# Patient Record
Sex: Male | Born: 1965 | Race: Black or African American | Hispanic: No | Marital: Married | Smoking: Current every day smoker
Health system: Southern US, Community
[De-identification: ages and names within clinical notes are randomized; demographics above are authoritative.]

## PROBLEM LIST (undated history)

## (undated) DIAGNOSIS — E785 Hyperlipidemia, unspecified: Secondary | ICD-10-CM

## (undated) DIAGNOSIS — N529 Male erectile dysfunction, unspecified: Secondary | ICD-10-CM

## (undated) DIAGNOSIS — Z8719 Personal history of other diseases of the digestive system: Secondary | ICD-10-CM

## (undated) DIAGNOSIS — I2510 Atherosclerotic heart disease of native coronary artery without angina pectoris: Secondary | ICD-10-CM

## (undated) DIAGNOSIS — K219 Gastro-esophageal reflux disease without esophagitis: Secondary | ICD-10-CM

## (undated) DIAGNOSIS — I10 Essential (primary) hypertension: Secondary | ICD-10-CM

## (undated) HISTORY — PX: COLONOSCOPY: SHX174

## (undated) HISTORY — PX: NO PAST SURGERIES: SHX2092

---

## 2006-05-11 ENCOUNTER — Emergency Department: Payer: Self-pay | Admitting: Emergency Medicine

## 2006-05-12 ENCOUNTER — Emergency Department: Payer: Self-pay | Admitting: Emergency Medicine

## 2006-05-13 ENCOUNTER — Emergency Department: Payer: Self-pay | Admitting: Emergency Medicine

## 2007-10-01 ENCOUNTER — Ambulatory Visit: Payer: Self-pay | Admitting: Family Medicine

## 2009-11-15 ENCOUNTER — Emergency Department: Payer: Self-pay | Admitting: Emergency Medicine

## 2012-03-11 ENCOUNTER — Ambulatory Visit: Payer: Self-pay | Admitting: Internal Medicine

## 2012-11-23 ENCOUNTER — Inpatient Hospital Stay: Payer: Self-pay | Admitting: Surgery

## 2012-11-23 ENCOUNTER — Ambulatory Visit: Payer: Self-pay | Admitting: Emergency Medicine

## 2012-11-23 LAB — URINALYSIS, COMPLETE
Bilirubin,UR: NEGATIVE
Cellular Cast: 15
Glucose,UR: NEGATIVE mg/dL (ref 0–75)
Hyaline Cast: 59
Leukocyte Esterase: NEGATIVE
Nitrite: NEGATIVE
Ph: 5 (ref 4.5–8.0)
RBC,UR: 4 /HPF (ref 0–5)
Specific Gravity: 1.028 (ref 1.003–1.030)
WBC UR: 24 /HPF (ref 0–5)

## 2012-11-23 LAB — COMPREHENSIVE METABOLIC PANEL
Albumin: 3.5 g/dL (ref 3.4–5.0)
Anion Gap: 9 (ref 7–16)
Bilirubin,Total: 1.6 mg/dL — ABNORMAL HIGH (ref 0.2–1.0)
Chloride: 105 mmol/L (ref 98–107)
Co2: 23 mmol/L (ref 21–32)
Creatinine: 1.32 mg/dL — ABNORMAL HIGH (ref 0.60–1.30)
Glucose: 109 mg/dL — ABNORMAL HIGH (ref 65–99)
Potassium: 3.9 mmol/L (ref 3.5–5.1)
SGOT(AST): 15 U/L (ref 15–37)
SGPT (ALT): 16 U/L (ref 12–78)
Sodium: 137 mmol/L (ref 136–145)
Total Protein: 7.9 g/dL (ref 6.4–8.2)

## 2012-11-23 LAB — CBC
MCH: 29.2 pg (ref 26.0–34.0)
MCHC: 34.5 g/dL (ref 32.0–36.0)
MCV: 85 fL (ref 80–100)
Platelet: 265 10*3/uL (ref 150–440)
RBC: 5.18 10*6/uL (ref 4.40–5.90)

## 2012-11-24 LAB — COMPREHENSIVE METABOLIC PANEL
Albumin: 2.8 g/dL — ABNORMAL LOW (ref 3.4–5.0)
Alkaline Phosphatase: 75 U/L (ref 50–136)
Anion Gap: 8 (ref 7–16)
BUN: 13 mg/dL (ref 7–18)
Calcium, Total: 8.7 mg/dL (ref 8.5–10.1)
Chloride: 106 mmol/L (ref 98–107)
Co2: 23 mmol/L (ref 21–32)
Creatinine: 0.91 mg/dL (ref 0.60–1.30)
EGFR (African American): >60
EGFR (Non-African Amer.): >60
SGOT(AST): 14 U/L — ABNORMAL LOW (ref 15–37)
SGPT (ALT): 13 U/L (ref 12–78)
Total Protein: 6.5 g/dL (ref 6.4–8.2)

## 2012-11-24 LAB — CBC WITH DIFFERENTIAL/PLATELET
Basophil %: 0.2 %
HCT: 37.3 % — ABNORMAL LOW (ref 40.0–52.0)
HGB: 12.6 g/dL — ABNORMAL LOW (ref 13.0–18.0)
Lymphocyte %: 8.8 %
Monocyte #: 1.1 x10 3/mm — ABNORMAL HIGH (ref 0.2–1.0)
Neutrophil %: 83.8 %
Platelet: 227 10*3/uL (ref 150–440)
RBC: 4.37 10*6/uL — ABNORMAL LOW (ref 4.40–5.90)
RDW: 13.5 % (ref 11.5–14.5)
WBC: 15.3 10*3/uL — ABNORMAL HIGH (ref 3.8–10.6)

## 2012-11-25 LAB — CBC WITH DIFFERENTIAL/PLATELET
Basophil #: 0 10*3/uL (ref 0.0–0.1)
Basophil %: 0.2 %
Eosinophil #: 0.1 10*3/uL (ref 0.0–0.7)
Eosinophil %: 0.4 %
HGB: 11.9 g/dL — ABNORMAL LOW (ref 13.0–18.0)
Lymphocyte #: 1.6 10*3/uL (ref 1.0–3.6)
Lymphocyte %: 9.5 %
MCH: 28.8 pg (ref 26.0–34.0)
MCHC: 33.9 g/dL (ref 32.0–36.0)
MCV: 85 fL (ref 80–100)
Monocyte #: 1 x10 3/mm (ref 0.2–1.0)
Monocyte %: 5.9 %
Neutrophil %: 84 %
Platelet: 213 10*3/uL (ref 150–440)

## 2012-11-26 LAB — CBC WITH DIFFERENTIAL/PLATELET
Basophil #: 0.1 10*3/uL (ref 0.0–0.1)
Eosinophil %: 0.5 %
HCT: 35.2 % — ABNORMAL LOW (ref 40.0–52.0)
HGB: 11.9 g/dL — ABNORMAL LOW (ref 13.0–18.0)
MCH: 28.8 pg (ref 26.0–34.0)
MCHC: 33.9 g/dL (ref 32.0–36.0)
Monocyte #: 1 x10 3/mm (ref 0.2–1.0)
Neutrophil #: 13.4 10*3/uL — ABNORMAL HIGH (ref 1.4–6.5)
Neutrophil %: 81.9 %
Platelet: 235 10*3/uL (ref 150–440)
RBC: 4.15 10*6/uL — ABNORMAL LOW (ref 4.40–5.90)
RDW: 13.3 % (ref 11.5–14.5)
WBC: 16.4 10*3/uL — ABNORMAL HIGH (ref 3.8–10.6)

## 2012-11-27 LAB — CBC WITH DIFFERENTIAL/PLATELET
Basophil %: 0.3 %
Eosinophil %: 1.8 %
Lymphocyte #: 1.8 10*3/uL (ref 1.0–3.6)
Lymphocyte %: 14.9 %
MCHC: 34.3 g/dL (ref 32.0–36.0)
Monocyte #: 1.1 x10 3/mm — ABNORMAL HIGH (ref 0.2–1.0)
Monocyte %: 9.5 %
RBC: 4.06 10*6/uL — ABNORMAL LOW (ref 4.40–5.90)
RDW: 13.5 % (ref 11.5–14.5)
WBC: 11.9 10*3/uL — ABNORMAL HIGH (ref 3.8–10.6)

## 2013-01-05 ENCOUNTER — Ambulatory Visit: Payer: Self-pay | Admitting: Gastroenterology

## 2014-08-14 ENCOUNTER — Observation Stay: Payer: Self-pay | Admitting: Internal Medicine

## 2014-08-14 ENCOUNTER — Ambulatory Visit: Payer: Self-pay | Admitting: Registered Nurse

## 2014-08-14 LAB — CK TOTAL AND CKMB (NOT AT ARMC)
CK, TOTAL: 132 U/L (ref 39–308)
CK, TOTAL: 114 U/L (ref 39–308)
CK, Total: 156 U/L (ref 39–308)
CK-MB: 0.6 ng/mL (ref 0.5–3.6)
CK-MB: <0.5 ng/mL — ABNORMAL LOW (ref 0.5–3.6)
CK-MB: 0.5 ng/mL (ref 0.5–3.6)

## 2014-08-14 LAB — COMPREHENSIVE METABOLIC PANEL
ALK PHOS: 97 U/L (ref 46–116)
ANION GAP: 6 — AB (ref 7–16)
Albumin: 3.4 g/dL (ref 3.4–5.0)
BUN: 13 mg/dL (ref 7–18)
Bilirubin,Total: 0.4 mg/dL (ref 0.2–1.0)
CO2: 26 mmol/L (ref 21–32)
Calcium, Total: 8.5 mg/dL (ref 8.5–10.1)
Chloride: 108 mmol/L — ABNORMAL HIGH (ref 98–107)
Creatinine: 1.17 mg/dL (ref 0.60–1.30)
Glucose: 92 mg/dL (ref 65–99)
Osmolality: 279 (ref 275–301)
POTASSIUM: 4 mmol/L (ref 3.5–5.1)
SGOT(AST): 24 U/L (ref 15–37)
SGPT (ALT): 20 U/L (ref 14–63)
SODIUM: 140 mmol/L (ref 136–145)
Total Protein: 7.3 g/dL (ref 6.4–8.2)

## 2014-08-14 LAB — PROTIME-INR
INR: 0.9
PROTHROMBIN TIME: 12.4 s

## 2014-08-14 LAB — TROPONIN I: Troponin-I: <0.02 ng/mL

## 2014-08-14 LAB — CBC
HCT: 46 % (ref 40.0–52.0)
HGB: 15.4 g/dL (ref 13.0–18.0)
MCH: 28.8 pg (ref 26.0–34.0)
MCHC: 33.5 g/dL (ref 32.0–36.0)
MCV: 86 fL (ref 80–100)
Platelet: 254 10*3/uL (ref 150–440)
RBC: 5.36 10*6/uL (ref 4.40–5.90)
RDW: 13.3 % (ref 11.5–14.5)
WBC: 14.2 10*3/uL — ABNORMAL HIGH (ref 3.8–10.6)

## 2014-08-14 LAB — TSH: THYROID STIMULATING HORM: 1.7 u[IU]/mL

## 2014-08-14 LAB — APTT: ACTIVATED PTT: 36 s — AB (ref 23.6–35.9)

## 2014-08-15 LAB — BASIC METABOLIC PANEL
Anion Gap: 5 — ABNORMAL LOW (ref 7–16)
BUN: 12 mg/dL (ref 7–18)
CREATININE: 1.11 mg/dL (ref 0.60–1.30)
Calcium, Total: 8 mg/dL — ABNORMAL LOW (ref 8.5–10.1)
Chloride: 108 mmol/L — ABNORMAL HIGH (ref 98–107)
Co2: 26 mmol/L (ref 21–32)
EGFR (African American): >60
EGFR (Non-African Amer.): >60
GLUCOSE: 117 mg/dL — AB (ref 65–99)
Osmolality: 278 (ref 275–301)
POTASSIUM: 3.8 mmol/L (ref 3.5–5.1)
Sodium: 139 mmol/L (ref 136–145)

## 2014-08-15 LAB — CBC WITH DIFFERENTIAL/PLATELET
BASOS ABS: 0.1 10*3/uL (ref 0.0–0.1)
Basophil %: 1.2 %
EOS ABS: 0.4 10*3/uL (ref 0.0–0.7)
Eosinophil %: 4.1 %
HCT: 41.2 % (ref 40.0–52.0)
HGB: 13.6 g/dL (ref 13.0–18.0)
Lymphocyte #: 4.3 10*3/uL — ABNORMAL HIGH (ref 1.0–3.6)
Lymphocyte %: 41.9 %
MCH: 28.4 pg (ref 26.0–34.0)
MCHC: 33 g/dL (ref 32.0–36.0)
MCV: 86 fL (ref 80–100)
MONOS PCT: 6.3 %
Monocyte #: 0.6 x10 3/mm (ref 0.2–1.0)
NEUTROS ABS: 4.7 10*3/uL (ref 1.4–6.5)
NEUTROS PCT: 46.5 %
Platelet: 238 10*3/uL (ref 150–440)
RBC: 4.79 10*6/uL (ref 4.40–5.90)
RDW: 13.6 % (ref 11.5–14.5)
WBC: 10.2 10*3/uL (ref 3.8–10.6)

## 2014-08-15 LAB — HEPARIN LEVEL (UNFRACTIONATED): Anti-Xa(Unfractionated): 0.42 IU/mL (ref 0.30–0.70)

## 2014-10-29 NOTE — H&P (Signed)
PATIENT NAME:  Paul Grant, Paul Grant MR#:  [id number] DATE OF BIRTH:  1966-01-08  DATE OF ADMISSION:  11/23/2012  REASON FOR ADMISSION: Suprapubic pain three days, nausea, vomiting, subjective fever and chills and constipation.   HISTORY OF PRESENT ILLNESS: Mr. Paul Grant is a pleasant relatively healthy 49 year old male with history of gastroesophageal reflux disease on Protonix who presents with acute onset of suprapubic pain. He says that it began three days ago and has progressively gotten worse. Yesterday it was excruciating and may be slightly better today and worse with movement, improved with Advil. It is also associated with nausea, vomiting, particularly yesterday. He  has not eaten anything recently. He also has subjective fevers and chills. Last bowel movement was yesterday, but appeared to be small, feels distended. Has pain with urination as well. No chest pain, shortness of breath, cough or hematuria.   PAST MEDICAL HISTORY:  1.  History of acid reflux.  2.  History of fracture.  HOME MEDICATIONS: Protonix 40 mg p.o. daily.   ALLERGIES: No known drug allergies.   SOCIAL HISTORY: [place] in [place]. He is here with his girlfriend, sister, mother and daughter. Smokes 1 pack a day. Denies any significant alcohol use or drugs.   FAMILY HISTORY: Family history of diabetes and hypertension. No family history of cancer, coronary artery disease and cerebrovascular accident.   REVIEW OF SYSTEMS: A 12-point review of systems review of systems obtained. Pertinent positives and negatives as above.   PHYSICAL EXAMINATION: VITAL SIGNS: Temperature 99.4, pulse 97, blood pressure 104/65, respirations 20, 96% on room air.  GENERAL: No acute distress. Alert and oriented x 3.     HEAD: Normocephalic, atraumatic.  EYES: No scleral icterus. No conjunctivitis.  FACE: No obvious facial trauma. Normal external nose. Normal external ears.  CHEST: Lungs clear to auscultation, moving air well.  HEART:  Regular rate and rhythm. No murmurs, rubs or gallops.  ABDOMEN: Soft, nondistended. Tender to palpation, particularly the suprapubic region, plus rebound. Does have tenderness in supraumbilical region when palpating the supraumbilical region in the lower umbilical  EXTREMITIES: Moves all extremities well. Strength 5/5.  NEUROLOGIC: Cranial nerves II through XII grossly intact.   LABORATORY DATA: Are as follows:  I have reviewed all labs. White cell count of 19.4, hemoglobin 13.1, hematocrit 43.8, platelets are 265. Bilirubin 1.6, creatinine is 1.32, bicarbonate is 23. Urinalysis also shows 24 white cells and 4 red cells.   CT scan shows impressive sigmoid diverticulitis with subdiaphragmatic and pericolonic air as well as some pelvic fluid.   ASSESSMENT AND PLAN: Mr. Paul Grant pleasant 49 year old male who presents acute diverticulitis. He is currently hemodynamically stable; however, due to the impressive nature of diverticulitis, would recommend nothing by mouth, IV antibiotics and resuscitation and will perform serial abdominal exams. No need for emergent surgery at this time. We will continue to follow. I have explained this to him and his family and they agree with this plan.    ____________________________ Si Raiderhristopher A. Taiga Lupinacci, MD cal:cc D: 11/23/2012 18:19:05 ET T: 11/23/2012 18:47:14 ET JOB#: [id number]  cc: Cristal Deerhristopher A. Rowyn Spilde, MD, <Dictator> Jarvis NewcomerHRISTOPHER A Twana Wileman MD ELECTRONICALLY SIGNED 11/27/2012 12:59

## 2014-10-29 NOTE — Discharge Summary (Signed)
PATIENT NAME:  Paul Grant, Oral C MR#:  [id number] DATE OF BIRTH:  09/26/1965  DATE OF ADMISSION:  11/23/2012 DATE OF DISCHARGE:  11/29/2012  DATE OF DICTATION: 12/11/2012   FINAL DIAGNOSIS: Perforated complicated sigmoid diverticulitis.   PRINCIPAL PROCEDURES:  1. CT scan of the abdomen and pelvis.  2. Intravenous antibiotics.   HOSPITAL COURSE SUMMARY: The patient was admitted on the 18th of May with a perforated sigmoid diverticulitis. He was treated with intravenous antibiotics. His white count remained elevated for several days. The patient, on hospital day #1, was still with significant tenderness. On hospital day #2, continued with tenderness. He had tenderness overlying the suprapubic region. I did discuss with him briefly possible surgical intervention on hospital day #2 if he did not improve. On hospital day #3, the patient was feeling much better, continuing to have low-grade temperatures to greater than 100. His antibiotics were continued. On the 23rd, a CT scan was obtained looking for drainable fluid collection. There was a tiny fluid collection around the colon. This was left alone. Overall, the patient continued to improve. By the 24th, he was tolerating a regular diet. His abdomen was soft and completely nontender and was discharged home in stable condition.   DISCHARGE MEDICATIONS:  1. Zantac 150 mg by mouth b.i.d.  2. Cipro 500 mg by mouth b.i.d.  3. Flagyl 500 mg by mouth t.i.d.  4. Percocet 5/325 one tab every 6 hours as needed for pain.   DISCHARGE INSTRUCTIONS: He will follow up in the office with Dr. Juliann PulseLundquist in 2 weeks. He will resume regular diet and activity.   ____________________________ Redge GainerMark A. Egbert GaribaldiBird, MD mab:OSi D: 12/11/2012 06:16:56 ET T: 12/11/2012 06:33:53 ET JOB#: [id number]  cc: Loraine LericheMark A. Egbert GaribaldiBird, MD, <Dictator> Shahla Betsill A Marvalene Barrett MD ELECTRONICALLY SIGNED 12/11/2012 8:14

## 2014-11-07 NOTE — Consult Note (Signed)
PATIENT NAME:  SUSIE, POUSSON MR#:  [id number] DATE OF BIRTH:  1966/06/19  DATE OF CONSULTATION:  08/14/2014  REFERRING PHYSICIAN:  Duane Lope. Judithann Sheen, MD CONSULTING PHYSICIAN:  Dwayne D. Callwood, MD  INDICATION: Atrial fibrillation and malignant hypertension.  HISTORY OF PRESENT ILLNESS: The patient is a 49 year old African American male, history of reflux disease, smoking, history of coronary artery disease, who presented to the Emergency Room with palpitations, tachycardia, shortness of breath over the last 24 hours. He was found to be in rapid atrial fibrillation as well as hypertensive. Denied any significant chest pain. Complained of dyspnea, shortness of breath. Initial troponin and TSH normal. EKG had nonspecific findings. He was admitted for further evaluation and care.  REVIEW OF SYSTEMS: No blackout spells or syncope. No nausea or vomiting. No fever. No chills. No sweats. No weight loss. No weight gain, hemoptysis, hematemesis. No bright red blood per rectum. Complains of smoking, shortness of breath, dyspnea, palpitations, tachycardia.   PAST MEDICAL HISTORY: Reflux, DJD, smoking.   MEDICATIONS: Zantac for reflux sometimes is necessary.   ALLERGIES: Negative.    SOCIAL HISTORY: Smokes. Denies significant alcohol consumption. Works for the city of [employer].  FAMILY HISTORY: Parents had hypertension, lung cancer, diabetes, heart disease, sick sinus syndrome.   PHYSICAL EXAMINATION:  VITAL SIGNS: Blood pressure initially was 180, pulse of 100, heart rate is 80 and irregular, respiratory rate is 16, afebrile.  HEENT: Normocephalic, atraumatic. Pupils equal and reactive to light. NECK: Supple. No JVD, bruits or adenopathy.  LUNGS: Clear to auscultation and percussion. No significant wheeze, rhonchi, or rale.  HEART: Irregularly irregular. Systolic ejection murmur at left sternal border. PMI nondisplaced.  ABDOMEN: Benign.  EXTREMITY: Within normal limits.  NEUROLOGIC: Intact.   SKIN: Normal.    LABORATORY DATA:  EKG: Rapid atrial fibrillation. Nonspecific ST-T changes. White count of 14, hemoglobin of 15. Troponin less than 0.02. TSH 1.7, glucose of 92, BUN 13, creatinine 1.17, sodium 140, potassium 4.0. Chest x-ray negative.   ASSESSMENT: New-onset atrial fibrillation, poorly controlled hypertension, smoking, strong family history of cardiac disease, degenerative joint disease, reflux, shortness of breath.   PLAN:  1.  Agree with admit. Rule out for myocardial infarction. Follow up cardiac enzymes. Follow up EKGs. Place on telemetry. Recommend treat atrial fibrillation for rate control. Hopefully the patient will convert on his own. Do not recommend antiarrhythmic this point. Continue short-term anticoagulation. The patient has a low CHADS score at this stage. Echocardiogram would be helpful to assess structural integrity of the heart for any abnormalities. He has a mild murmur as well as shortness of breath. Would consider further evaluation including functional study as well as echocardiogram.  2.  For reflux, continue Zantac. Will consider omeprazole therapy for reflux symptoms. Must be concerned that this may represent angina, but will treat for reflux now.  3.  Hypertension. Recommend possibly an ACE inhibitor or low-dose beta blocker with diuretic to see if the patient responds. 4.  Smoking. Advised the patient to quit smoking as part of smoking prevention. Lipid profile should be evaluated and assessed.  5.  Significant family history. Will need further evaluation and care.  Again, initial atrial fibrillation rate control with beta blocker, calcium blocker. Add ACE inhibitor for blood pressure control. Short-term anticoagulation including aspirin. Base further evaluation on the results of studies for his heart. No long-term anticoagulation is necessary at this point.    ____________________________ Bobbie Stack. Juliann Pares, MD ddc:ST D: 08/14/2014 23:55:52  ET T: 08/15/2014 01:09:25 ET JOB#:  [id number]  cc: Dwayne D. Juliann Paresallwood, MD, <Dictator> Alwyn PeaWAYNE D CALLWOOD MD ELECTRONICALLY SIGNED 08/17/2014 17:46

## 2014-11-07 NOTE — Discharge Summary (Signed)
PATIENT NAME:  Paul Grant, Paul Grant MR#:  [id number] DATE OF BIRTH:  12-07-1965  DATE OF ADMISSION:  08/14/2014 DATE OF DISCHARGE:  08/15/2014  ADMITTING DIAGNOSIS:  Atrial fibrillation, rapid ventricular response.  DISCHARGE DIAGNOSES: 1.  Atrial fibrillation, rapid ventricular response, converted to sinus rhythm. 2.  Dyspnea due to atrial fibrillation, rapid ventricular response.  3.  Malignant essential hypertension.  4.  Leukocytosis, likely stress related.  5.  Tobacco abuse. 6.  History of gastroesophageal reflux disease.  7.  Cardiomyopathy on echocardiogram with ejection fraction of 35%-40%. 8.  Mild tricuspid regurgitation as well as mild mitral valve regurgitation.   DISCHARGE CONDITION: Stable.   DISCHARGE MEDICATIONS: The patient is to continue Zantac 150 mg p.o. once daily, aspirin 81 mg p.o. daily. New medication, diltiazem extended release 120 mg p.o. once daily.   HOME OXYGEN: None.   DIET: Two-gram salt, low-fat, low-cholesterol diet. Consistency: Regular.   ACTIVITY LIMITATIONS: As tolerated.   FOLLOWUP APPOINTMENT: With Dr. Juliann Pares in 2 days after discharge.   HISTORY OF PRESENT ILLNESS: The patient is a 49 year old Caucasian male with past medical history significant for history of gastroesophageal reflux, who presents to the hospital with complaints of palpitations as well as shortness of breath. Please refer to Dr. Judithann Sheen admission note on 08/14/2014.   On arrival to the hospital the patient's temperature was 98.6, pulse was 82, blood pressure 173/101, respiration rate was 18, oxygen saturation was 97% on room air. Physical exam revealed irregular, irregular rhythm, but otherwise no abnormalities.  LABORATORY DATA: Done on arrival to the Emergency Room showed a normal BMP. The patient's liver enzymes were unremarkable. Cardiac enzymes x 3 were normal. TSH was 1.7, which is normal. White blood cell count was moderately elevated at 14.2, hemoglobin was 15.4, platelet  count was 254,000. Coagulation panel revealed pro time 12.4, INR 0.9, activated PTT was 36.0.  HOSPITAL COURSE: The patient was admitted to the hospital for further evaluation. His cardiac enzymes were cycled and consultation with Dr. Juliann Pares was obtained. Dr. Juliann Pares saw the patient in consultation the same day on 08/14/2014.  He felt the patient had atrial fibrillation, RVR.  He recommended to continue control of this atrial fibrillation, RVR with beta blocker as well as calcium channel blocker. He recommended also to add ACE inhibitor for blood pressure control.   The patient was initiated on medications and he converted into sinus rhythm. With conversion to sinus rhythm, his heart rate as well as blood pressure improved. He was advised to continue calcium channel blocker, Cardizem CD at 120 mg p.o. daily dose. His blood pressure with this dose remains somewhat marginal and we were not able to initiate ACE inhibitor due to blood pressure problems, which the patient would benefit from for cardiomyopathy. In regard to  dyspnea, it was felt that patient's dyspnea was related to atrial fibrillation, RVR, as well as possibly cardiomyopathy itself. The patient is advised to continue followup with cardiologist as well as his primary care physician for further recommendations.   In regard to malignant essential hypertension, the patient's blood pressure was well controlled with diltiazem extended release and on the day of discharge it was 100/65.   The patient was noted to have mild leukocytosis, which was likely stress related since his white blood cell count improved without antimicrobial therapy dayly. For tobacco abuse, he was counseled and recommended to quit. Nicotine replacement therapy was not initiated, although would be beneficial.  For history of gastroesophageal reflux disease, the patient is to  continue his outpatient management. No changes were made. The patient is being discharged in stable  condition with the above-mentioned medications and followup.   TIME SPENT: 35-40 minutes.    ____________________________ Katharina Caperima Ival Basquez, MD rv:LT D: 08/15/2014 15:17:23 ET T: 08/15/2014 17:15:12 ET JOB#: [id number]  cc: Katharina Caperima Adream Parzych, MD, <Dictator> Dwayne D. Juliann Paresallwood, MD Katharina CaperIMA Kenyatte Chatmon MD ELECTRONICALLY SIGNED 08/31/2014 22:07

## 2014-11-07 NOTE — H&P (Signed)
PATIENT NAME:  GIOVANIE, LEFEBRE MR#:  [id number] DATE OF BIRTH:  March 30, 1966  DATE OF ADMISSION:  08/14/2014  REFERRING PHYSICIAN: Dr. Daryel November.   FAMILY PHYSICIAN: Nonlocal.   REASON FOR ADMISSION: New ASA with malignant hypertension.   HISTORY OF PRESENT ILLNESS: The patient is a 49 year old male with history of GE reflux disease, who presents to the emergency room with palpitations and shortness of breath. In the emergency room, the patient was noted to be malignantly hypertensive in rapid atrial fibrillation. No chest pain. Initial troponin and TSH were normal. EKG showed no ischemic changes. He is now admitted for further evaluation.   PAST MEDICAL HISTORY: 1. GE reflux disease.  2. Status post left ankle fracture.  3. Tobacco abuse.   MEDICATIONS: Zantac 150 mg p.o. daily.   ALLERGIES: No known drug allergies.   SOCIAL HISTORY: The patient smokes about 1 pack per day. Denies alcohol abuse.   FAMILY HISTORY: Positive for positive for hypertension, lung cancer, diabetes and heart disease.   REVIEW OF SYSTEMS:  CONSTITUTIONAL: No fever or change in weight.   EYES: No blurred or double vision. No glaucoma.   ENT: No tinnitus or hearing loss. No nasal discharge or bleeding. No difficulty swallowing.   RESPIRATORY: No cough or wheezing. Denies hemoptysis. No painful respiration.   CARDIOVASCULAR: No chest pain or orthopnea. No syncope.   GASTROINTESTINAL: No nausea, vomiting, or diarrhea. No abdominal pain. No change in bowel habits.   GENITOURINARY: No dysuria or hematuria. No incontinence.   ENDOCRINE: No polyuria or polydipsia. No heat or cold intolerance.   HEMATOLOGIC: The patient denies anemia, easy bruising or bleeding.   LYMPHATIC: No swollen glands.   MUSCULOSKELETAL: The patient has pain in his neck, back, shoulders, knees or hips. No gout.   NEUROLOGIC: No numbness or migraines. Denies stroke or seizures.   PSYCHOLOGICAL: The patient denies  anxiety, insomnia or depression.   PHYSICAL EXAMINATION: GENERAL: The patient is in no acute distress.   VITAL SIGNS: Currently remarkable for a blood pressure of 123/101 with a heart rate of 82 and a respiratory rate of 18, temperature of 98.6 and a sat of 97% on room air.   HEENT: Normocephalic, atraumatic. Pupils are equal, round and reactive to light and accommodation, extraocular movements are intact. Sclerae are anicteric. Conjunctivae are clear. Oropharynx is clear.   NECK: Supple without JVD. No adenopathy or thyromegaly is noted.   LUNGS: Clear to auscultation and percussion without wheezes, rales or rhonchi. No dullness. Respiratory effort is normal.   CARDIAC: Irregularly irregular rhythm. No significant rubs or gallops. PMI is nondisplaced. Chest wall is nontender.   ABDOMEN: Soft, nontender, with normoactive bowel sounds. No organomegaly or masses were appreciated. No hernias or bruits were noted.   EXTREMITIES: Without clubbing, cyanosis or edema. Pulses were 2+ bilaterally.   SKIN: Warm and dry without rash or lesions.   NEUROLOGIC: Cranial nerves 2 through 12 grossly intact. Deep tendon reflexes were symmetric. Motor and sensory examination is nonfocal.   PSYCHIATRIC: Revealed a patient who was alert and oriented to person, place and time. He was cooperative and used good judgment.   LABORATORY DATA: EKG revealed atrial fibrillation with no acute ischemic changes at 99 beats per minute. His white count was 14.2 with a hemoglobin of 15.4. Troponin was less than 0.02. TSH was 1.7. Glucose 92 with a BUN of 13, creatinine 1.17 with a sodium of 140 and a potassium of 4.0.   DIAGNOSTIC DATA: Chest x-ray was  unremarkable.   ASSESSMENT: 1. New-onset atrial fibrillation.  2. Shortness of breath with dyspnea.  3. Malignant hypertension.  4. Gastroesophageal reflux disease.   PLAN: The patient will be observed on telemetry with oral Cardizem and beta blocker. A heparin drip  was started in the emergency room which we will continue for now. We will follow his blood pressure closely. Will order serial cardiac enzymes and an echocardiogram. We will also obtain a cardiology consult. Further treatment and evaluation will depend upon the patient's progress.   TOTAL TIME SPENT: On this patient was 45 minutes.    ____________________________ Duane LopeJeffrey D. Judithann SheenSparks, MD jds:TT D: 08/14/2014 16:38:45 ET T: 08/14/2014 16:46:53 ET JOB#: [id number]  cc: Duane LopeJeffrey D. Judithann SheenSparks, MD, <Dictator> Charlea Nardo Rodena Medin Tyjah Hai MD ELECTRONICALLY SIGNED 08/15/2014 13:35

## 2017-05-27 ENCOUNTER — Other Ambulatory Visit: Payer: Self-pay

## 2017-05-27 ENCOUNTER — Ambulatory Visit
Admission: EM | Admit: 2017-05-27 | Discharge: 2017-05-27 | Disposition: A | Discharge status: Home / Self Care | Payer: 59 | Attending: Family Medicine | Admitting: Family Medicine

## 2017-05-27 DIAGNOSIS — L03115 Cellulitis of right lower limb: Secondary | ICD-10-CM | POA: Diagnosis not present

## 2017-05-27 DIAGNOSIS — L02425 Furuncle of right lower limb: Secondary | ICD-10-CM | POA: Diagnosis not present

## 2017-05-27 DIAGNOSIS — L02429 Furuncle of limb, unspecified: Secondary | ICD-10-CM

## 2017-05-27 HISTORY — DX: Personal history of other diseases of the digestive system: Z87.19

## 2017-05-27 MED ORDER — SULFAMETHOXAZOLE-TRIMETHOPRIM 800-160 MG PO TABS: 1.0000 | ORAL_TABLET | Freq: Two times a day (BID) | ORAL | 0 refills | Status: DC

## 2017-05-27 NOTE — ED Provider Notes (Signed)
[facility]    CSN: [id number] Arrival date & time: 05/27/17  1625     History   Chief Complaint Chief Complaint  Patient presents with  . Abscess    HPI Paul Grant is a 51 y.o. male.   The history is provided by the patient.  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 History of diverticulitis     There are no active problems to display for this patient.   Past Surgical History:  Procedure Laterality Date  . NO PAST SURGERIES         Home Medications    Prior to Admission medications   Medication Sig Start Date End Date Taking? Authorizing Provider  sulfamethoxazole-trimethoprim (BACTRIM DS,SEPTRA DS) 800-160 MG tablet Take 1 tablet 2 (two) times daily by mouth. 05/27/17   Payton Mccallumonty, Tymir Terral, MD    Family History Family History  Problem Relation Age of Onset  . Diabetes Mother   . Hypertension Mother   . Heart attack Father   . Hypertension Father     Social History Social History   Tobacco Use  . Smoking status: Current Every Day Smoker    Packs/day: 0.10  . Smokeless tobacco: Never Used  Substance Use Topics  . Alcohol use: Yes    Comment: rarely  . Drug use: No     Allergies   Patient has no known allergies.   Review of Systems Review of Systems  Constitutional: Negative for fatigue and fever.  Gastrointestinal: Negative for anorexia, nausea and vomiting.  Neurological: Negative for headaches.      Physical Exam Triage Vital Signs ED Triage Vitals  Enc Vitals Group     BP 05/27/17 1640 (!) 134/94     Pulse Rate 05/27/17 1640 95     Resp 05/27/17 1640 18     Temp 05/27/17 1640 98.9 F (37.2 C)     Temp Source 05/27/17 1640 Oral     SpO2 05/27/17 1640 99 %     Weight 05/27/17 1639 173 lb (78.5 kg)     Height 05/27/17 1639 5\' 10"  (1.778 m)     Head Circumference --      Peak Flow --      Pain Score 05/27/17 1639 6     Pain Loc --      Pain Edu? --      Excl. in GC? --    No data found.  Updated Vital Signs BP (!) 134/94 (BP Location: Left Arm)   Pulse 95   Temp 98.9 F (37.2 C) (Oral)   Resp 18   Ht 5\' 10"  (1.778 m)   Wt 173 lb (78.5 kg)   SpO2 99%   BMI 24.82 kg/m   Visual Acuity Right Eye Distance:   Left Eye Distance:   Bilateral Distance:    Right Eye Near:   Left Eye Near:    Bilateral Near:     Physical Exam  Constitutional: He appears  well-developed and well-nourished. No distress.  Skin: He is not diaphoretic. There is erythema.  3 cm erythematous indurated skin area just above the knee with blanchable erythema, warmth and tenderness to palpation  Nursing note and vitals reviewed.    UC Treatments / Results  Labs (all labs ordered are listed, but only abnormal results are displayed) Labs Reviewed - No data to display  EKG  EKG Interpretation None       Radiology No results found.  Procedures Procedures (including critical care time)  Medications Ordered in UC Medications - No data to display   Initial Impression / Assessment and Plan / UC Course  I have reviewed the triage vital signs and the nursing notes.  Pertinent labs & imaging results that were available during my care of the patient were reviewed by me and considered in my medical decision making (see chart for details).       Final Clinical Impressions(s) / UC Diagnoses   Final diagnoses:  Boil, leg  Cellulitis of leg, right    ED Discharge Orders         Ordered    sulfamethoxazole-trimethoprim (BACTRIM DS,SEPTRA DS) 800-160 MG tablet  2 times daily     05/27/17 1700     1. diagnosis reviewed with patient 2. rx as per orders above; reviewed possible side effects, interactions, risks and benefits  3. Recommend supportive treatment with warm compresses, otc analgesics prn 4. Follow-up prn if symptoms worsen or don't improve  Controlled Substance Prescriptions [place] Controlled Substance Registry consulted? Not Applicable   Payton Mccallumonty, Rubin Dais, MD 05/27/17 1710

## 2017-05-27 NOTE — ED Triage Notes (Signed)
Patient complains of right knee abscess. Patient states that area started on Friday. Patient states that area has been red and tender. No drainage from area.

## 2017-05-31 ENCOUNTER — Other Ambulatory Visit: Payer: Self-pay

## 2017-05-31 ENCOUNTER — Encounter: Payer: Self-pay | Admitting: *Deleted

## 2017-05-31 ENCOUNTER — Ambulatory Visit
Admission: EM | Admit: 2017-05-31 | Discharge: 2017-05-31 | Disposition: A | Discharge status: Hospice | Payer: 59 | Attending: Family Medicine | Admitting: Family Medicine

## 2017-05-31 DIAGNOSIS — L03115 Cellulitis of right lower limb: Secondary | ICD-10-CM

## 2017-05-31 NOTE — ED Triage Notes (Signed)
Patient has returned for a wound check. Reports swelling, redness at the wound have become worse.

## 2017-05-31 NOTE — ED Provider Notes (Signed)
[facility]    CSN: [id number] Arrival date & time: 05/31/17  [id number]     History   Chief Complaint Chief Complaint  Patient presents with  . Wound Check    HPI Paul Grant is a 51 y.o. male.   51 yo male with a recent diagnosis of cellulitis of right lower extremity, just above the knee, presents with worsening symptoms of redness spreading, increase swelling and chills, despite taking oral antibiotic for one week.    The history is provided by the patient.  Wound Check     Past Medical History:  Diagnosis Date  . History of diverticulitis     There are no active problems to display for this patient.   Past Surgical History:  Procedure Laterality Date  . NO PAST SURGERIES         Home Medications    Prior to Admission medications   Medication Sig Start Date End Date Taking? Authorizing Provider  sulfamethoxazole-trimethoprim (BACTRIM DS,SEPTRA DS) 800-160 MG tablet Take 1 tablet 2 (two) times daily by mouth. 05/27/17  Yes Payton Mccallumonty, Toni Hoffmeister, MD    Family History Family History  Problem Relation Age of Onset  . Diabetes Mother   . Hypertension Mother   . Heart attack Father   . Hypertension Father     Social History Social History   Tobacco Use  . Smoking status: Current Every Day Smoker    Packs/day: 0.10  . Smokeless tobacco: Never Used  Substance Use Topics  . Alcohol use: Yes    Comment: rarely  . Drug use: No     Allergies   Patient has no known allergies.   Review of Systems Review of Systems   Physical Exam Triage Vital Signs ED Triage Vitals  Enc Vitals Group     BP 05/31/17 0959 122/86     Pulse Rate 05/31/17 0959 77     Resp 05/31/17 0959 18     Temp 05/31/17 0959 98.6 F (37 C)     Temp Source 05/31/17 0959 Oral     SpO2 05/31/17 0959 100 %     Weight --      Height --      Head Circumference --      Peak Flow --      Pain Score 05/31/17 1001 5     Pain Loc --      Pain Edu? --    Excl. in GC? --    No data found.  Updated Vital Signs BP 122/86 (BP Location: Left Arm)   Pulse 77   Temp 98.6 F (37 C) (Oral)   Resp 18   SpO2 100%   Visual Acuity Right Eye Distance:   Left Eye Distance:   Bilateral Distance:    Right Eye Near:   Left Eye Near:    Bilateral Near:     Physical Exam  Constitutional: He appears well-developed and well-nourished. No distress.  Skin: He is not diaphoretic. There is erythema.  Right lower extremity skin just above the knee with fluctuant yellow, erythematous mass with spreading blanchable erythema, warmth and tenderness to palpation  Nursing note and vitals reviewed.    UC Treatments / Results  Labs (all labs ordered are listed, but only abnormal results are displayed) Labs Reviewed - No data to display  EKG  EKG Interpretation None       Radiology No results found.  Procedures Procedures (including critical care time)  Medications Ordered in UC Medications -  No data to display   Initial Impression / Assessment and Plan / UC Course  I have reviewed the triage vital signs and the nursing notes.  Pertinent labs & imaging results that were available during my care of the patient were reviewed by me and considered in my medical decision making (see chart for details).       Final Clinical Impressions(s) / UC Diagnoses   Final diagnoses:  Cellulitis of right lower extremity  (failing outpatient therapy)  ED Discharge Orders    None     1. diagnosis reviewed with patient; discussed with patient, recommend patient go to Emergency Department for further evaluation and management due to worsening symptoms despite outpatient oral antibiotic therapy.   Controlled Substance Prescriptions [place] Controlled Substance Registry consulted? Not Applicable   Payton Mccallumonty, Daphney Hopke, MD 05/31/17 1152

## 2018-07-17 ENCOUNTER — Encounter: Payer: Self-pay | Admitting: Emergency Medicine

## 2018-07-17 ENCOUNTER — Other Ambulatory Visit: Payer: Self-pay

## 2018-07-17 ENCOUNTER — Ambulatory Visit
Admission: EM | Admit: 2018-07-17 | Discharge: 2018-07-17 | Disposition: A | Discharge status: Home / Self Care | Payer: 59 | Attending: Emergency Medicine | Admitting: Emergency Medicine

## 2018-07-17 DIAGNOSIS — L03115 Cellulitis of right lower limb: Secondary | ICD-10-CM | POA: Diagnosis not present

## 2018-07-17 MED ORDER — SULFAMETHOXAZOLE-TRIMETHOPRIM 800-160 MG PO TABS: 1.0000 | ORAL_TABLET | Freq: Two times a day (BID) | ORAL | 0 refills | Status: AC

## 2018-07-17 MED ORDER — MUPIROCIN 2 % EX OINT: TOPICAL_OINTMENT | CUTANEOUS | 0 refills | Status: DC

## 2018-07-17 NOTE — Discharge Instructions (Addendum)
Take medication as prescribed. Rest. Drink plenty of fluids.  Elevate.  Monitor.  Follow up with your primary care physician this week as needed. Return to Urgent care for new or worsening concerns.   

## 2018-07-17 NOTE — ED Provider Notes (Signed)
[facility] ____________________________________________  Time seen: Approximately 9:03 AM  I have reviewed the triage vital signs and the nursing notes.   HISTORY  Chief Complaint Foot Pain (right)   HPI Paul Grant is a 53 y.o. male presenting for evaluation of redness and skin changes to right foot for the last 3 days.  States 3 days ago he noticed what he suspected to be a insect bite or a bump to the top of his fourth toe, and reports since then the area has become red surrounding with some swelling.  States that he tried to drain the area yesterday without any drainage.  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  Denies any known insect bite or trauma.  No known injury.  Has continued remain ambulatory.  States pain is very mild and mostly with direct weightbearing.  States he wears steel toed boots at work and the area may have been rubbed by the boot.  Reports tetanus immunization is up-to-date.  Has been elevating intermittently and keeping clean.  Denies other aggravating alleviating factors.  Denies recent sickness.  Reports otherwise doing well.  Denies other skin changes.  [place]: PCP   Past Medical History:  Diagnosis Date  . History of diverticulitis     There are no active problems to display for this patient.   Past Surgical History:  Procedure Laterality Date  . NO PAST SURGERIES       No current facility-administered medications for this encounter.   Current Outpatient Medications:  .  mupirocin ointment (BACTROBAN) 2 %, Apply two times a day for 10 days., Disp: 22 g, Rfl: 0 .  sulfamethoxazole-trimethoprim (BACTRIM DS,SEPTRA DS) 800-160 MG tablet, Take 1 tablet by mouth 2 (two) times daily for 10 days., Disp: 20 tablet, Rfl: 0  Allergies Patient has no known allergies.  Family History  Problem Relation Age of Onset  . Diabetes Mother   . Hypertension Mother   . Heart attack Father   . Hypertension Father      Social History Social History   Tobacco Use  . Smoking status: Current Every Day Smoker    Packs/day: 0.50  . Smokeless tobacco: Never Used  Substance Use Topics  . Alcohol use: Yes    Comment: rarely  . Drug use: No    Review of Systems Constitutional: No fever. Cardiovascular: Denies chest pain. Respiratory: Denies shortness of breath. Gastrointestinal: No abdominal pain.  Musculoskeletal: Negative for back pain. Skin: Positive for rash  ____________________________________________   PHYSICAL EXAM:  VITAL SIGNS: ED Triage Vitals  Enc Vitals Group     BP 07/17/18 0830 124/87     Pulse Rate 07/17/18 0830 98     Resp 07/17/18 0830 16     Temp 07/17/18 0830 98.5 F (36.9 C)     Temp Source 07/17/18 0830 Oral     SpO2 07/17/18 0830 99 %     Weight 07/17/18 0828 170 lb (77.1 kg)     Height 07/17/18 0828 5\' 10"  (1.778 m)     Head Circumference --      Peak Flow --      Pain Score 07/17/18 0827 7     Pain Loc --      Pain Edu? --      Excl. in GC? --     Constitutional: Alert and oriented. Well appearing and in no acute distress. ENT      Head: Normocephalic and atraumatic. Cardiovascular: Normal rate, regular rhythm.  Grossly normal heart sounds.  Good peripheral circulation. Respiratory: Normal respiratory effort without tachypnea nor retractions. Breath sounds are clear and equal bilaterally. No wheezes, rales, rhonchi. Musculoskeletal:  Steady gait.  Neurologic:  Normal speech and language.  Speech is normal. No gait instability.  Skin:  Skin is warm, dry.  Except:     Dorsal lower foot erythema as above with mild edema to proximal fourth toe with excoriated crusted area, no fluctuance, no drainage, mildly tender to palpation around erythema site, no other tenderness, normal distal sensation and capillary refill, right foot with normal range of motion.  Bilateral distal pedal pulses equal and easily palpated.  Psychiatric: Mood and affect are normal.  Speech and behavior are normal. Patient exhibits appropriate insight and judgment   ___________________________________________   LABS (all labs ordered are listed, but only abnormal results are displayed)  Labs Reviewed - No data to display   PROCEDURES Procedures    INITIAL IMPRESSION / ASSESSMENT AND PLAN / ED COURSE  Pertinent labs & imaging results that were available during my care of the patient were reviewed by me and considered in my medical decision making (see chart for details).  Well-appearing patient.  No acute distress.  Right dorsal foot cellulitis.  See images above.  Will treat with oral Bactrim and topical Bactroban.  Discussed keeping clean, warm soapy water soaks, elevating and monitoring.  Over-the-counter ibuprofen as needed.  Wound check in 2 to 3 days as needed.  Work note for today and tomorrow given.Discussed indication, risks and benefits of medications with patient.  Discussed follow up with Primary care physician this week. Discussed follow up and return parameters including no resolution or any worsening concerns. Patient verbalized understanding and agreed to plan.   ____________________________________________   FINAL CLINICAL IMPRESSION(S) / ED DIAGNOSES  Final diagnoses:  Cellulitis of right foot     ED Discharge Orders         Ordered    sulfamethoxazole-trimethoprim (BACTRIM DS,SEPTRA DS) 800-160 MG tablet  2 times daily     07/17/18 0852    mupirocin ointment (BACTROBAN) 2 %     07/17/18 [id number]           Note: This dictation was prepared with Dragon dictation along with smaller phrase technology. Any transcriptional errors that result from this process are unintentional.         Renford DillsMiller, Montzerrat Brunell, NP 07/17/18 [phone]

## 2018-07-17 NOTE — ED Triage Notes (Signed)
Pt c/o right foot pain. He reports that it started about 2-3 days with a slight "tinglng" but then last night it worsened. Pain is located at the top of his foot and is red and swollen. No known injury he does have a area on his 4th digit that is a possible insect bite per patient. He reports that it is difficult to walk on his right foot.

## 2019-12-05 ENCOUNTER — Ambulatory Visit
Admission: EM | Admit: 2019-12-05 | Discharge: 2019-12-05 | Disposition: A | Discharge status: Home / Self Care | Payer: 59

## 2019-12-05 ENCOUNTER — Encounter: Payer: Self-pay | Admitting: Emergency Medicine

## 2019-12-05 ENCOUNTER — Other Ambulatory Visit: Payer: Self-pay

## 2019-12-05 DIAGNOSIS — H00011 Hordeolum externum right upper eyelid: Secondary | ICD-10-CM

## 2019-12-05 DIAGNOSIS — S0501XA Injury of conjunctiva and corneal abrasion without foreign body, right eye, initial encounter: Secondary | ICD-10-CM

## 2019-12-05 HISTORY — DX: Gastro-esophageal reflux disease without esophagitis: K21.9

## 2019-12-05 HISTORY — DX: Hyperlipidemia, unspecified: E78.5

## 2019-12-05 HISTORY — DX: Essential (primary) hypertension: I10

## 2019-12-05 MED ORDER — ERYTHROMYCIN 5 MG/GM OP OINT: TOPICAL_OINTMENT | OPHTHALMIC | 0 refills | Status: DC

## 2019-12-05 NOTE — ED Triage Notes (Addendum)
Patient in today c/o right eye redness and draining x 2 days. Patient not sure if he has something in his eye from weed eating a week ago. Patient states he feels a bump on the outer corner of his eyelid.

## 2019-12-05 NOTE — Discharge Instructions (Addendum)
It was very nice seeing you today in clinic. Thank you for entrusting me with your care.   Avoid rubbing eye. Use medication as prescribed. May use warm compresses to help with the irritation. May use Tylenol and/or Ibuprofen as needed for pain/fever.   Make arrangements to follow up with your regular doctor in 1 week for re-evaluation if not improving. If your symptoms/condition worsens, please seek follow up care either here or in the ER. Please remember, our [facility] providers are "right here with you" when you need [place].   Again, it was my pleasure to take care of you today. Thank you for choosing our clinic. I hope that you start to feel better quickly.   Quentin Mulling, MSN, APRN, FNP-C, CEN Advanced Practice Provider [facility]

## 2019-12-06 NOTE — ED Provider Notes (Signed)
[place] [place]   Name: Paul Grant DOB: May 31, 1966 MRN: [id number] CSN: [id number] PCP: [facility]  Arrival date and time:  12/05/19 2542  Chief Complaint:  Eye Problem (right)  NOTE: Prior to seeing the patient today, I have reviewed the triage nursing documentation and vital signs. Clinical staff has updated patient's PMH/PSHx, current medication list, and drug allergies/intolerances to ensure comprehensive history available to assist in medical decision making.   History:   HPI: Paul Grant is a 54 y.o. male who presents today with complaints of pain and redness in his RIGHT eye that began 2 days ago. He denies any known injury to the eye, however is concerned that he got something into his eye when weed eating.  Additionally, patient has a "bump" to the outer aspect of his RIGHT upper eyelid that is painful. Patient with excessive tearing. He has experienced mild exudative crusting. Vision somewhat blurred. Patient notes that eye irritation is not exacerbated by light (photophobia). He does not wear contact lenses. Patient has not tried any over the counter eye drops or pain medications to help reduce/relieve his current symptoms.   Past Medical History:  Diagnosis Date  . GERD (gastroesophageal reflux disease)   . History of diverticulitis   . Hyperlipidemia   . Hypertension     Past Surgical History:  Procedure Laterality Date  . NO PAST SURGERIES      Family History  Problem Relation Age of Onset  . Diabetes Mother   . Hypertension Mother   . Heart attack Father   . Hypertension Father     Social History   Tobacco Use  . Smoking status: Current Every Day Smoker    Packs/day: 0.50    Years: 20.00    Pack years: 10.00  . Smokeless tobacco: Never Used  Substance Use Topics  . Alcohol use: Yes    Comment: rarely  . Drug use: No    There are no problems to display for this patient.   Home Medications:    Current  Meds  Medication Sig  . aspirin 81 MG EC tablet Take 1 tablet by mouth daily.  Marland Kitchen diltiazem (CARDIZEM CD) 240 MG 24 hr capsule Take 240 mg by mouth daily.  Marland Kitchen esomeprazole (NEXIUM) 20 MG capsule Take 1 capsule by mouth daily.  . rosuvastatin (CRESTOR) 5 MG tablet Take 5 mg by mouth daily.  . sildenafil (VIAGRA) 100 MG tablet TAKE 1 TABLET BY MOUTH ONCE DAILY AS NEEDED FOR ERECTILE DYSFUNCTION UP TO FOR 30 DAYS    Allergies:   Patient has no known allergies.  Review of Systems (ROS):  Review of systems NEGATIVE unless otherwise noted in narrative H&P section.   Vital Signs: Today's Vitals   12/05/19 0939 12/05/19 0940 12/05/19 1004  BP: 129/87    Pulse: 73    Resp: 18    Temp: 98.3 F (36.8 C)    TempSrc: Oral    SpO2: 99%    Weight:  200 lb (90.7 kg)   Height:  5\' 8"  (1.727 m)   PainSc:  0-No pain 0-No pain    Physical Exam: Physical Exam  Constitutional: He is oriented to person, place, and time and well-developed, well-nourished, and in no distress.  HENT:  Head: Normocephalic and atraumatic.  Eyes: Pupils are equal, round, and reactive to light. EOM are normal. Right eye exhibits hordeolum. Right conjunctiva is injected.    Visual Acuity Right Eye Distance: 20/20 Left Eye Distance: 20/25 Bilateral  Distance: 20/20  Cardiovascular: Normal rate and intact distal pulses.  Pulmonary/Chest: Effort normal.  Neurological: He is alert and oriented to person, place, and time. Gait normal.  Skin: Skin is warm and dry. No rash noted. He is not diaphoretic.  Psychiatric: Mood, memory, affect and judgment normal.  Nursing note and vitals reviewed.  Wood's lamp examination of the RIGHT eye . Consent: Procedure explained to patient prior to performing. Drug allergies reviewed. Verbal consent obtained.  . Procedural course: Topical anesthetic (tetracaine) gtts instilled into the affected eye. Adequate time allowed for medication to be effective. Fluorescein stain applied. Eye  thoroughly examined under black light.  . Findings: Examination revealed increased fluorescein uptake in the lower outer aspect of the cornea, which is consistent with abrasion.  . Tolerance: Patient tolerated exam/procedure well  Urgent Care Treatments / Results:   No orders of the defined types were placed in this encounter.   LABS: PLEASE NOTE: all labs that were ordered this encounter are listed, however only abnormal results are displayed. Labs Reviewed - No data to display  EKG: -None  RADIOLOGY: No results found.  PROCEDURES: Procedures  MEDICATIONS RECEIVED THIS VISIT: Medications - No data to display  PERTINENT CLINICAL COURSE NOTES/UPDATES:   Initial Impression / Assessment and Plan / Urgent Care Course:  Pertinent labs & imaging results that were available during my care of the patient were personally reviewed by me and considered in my medical decision making (see lab/imaging section of note for values and interpretations).  Paul Grant is a 54 y.o. male who presents to [facility] today with complaints of Eye Problem (right)  Patient is well appearing overall in clinic today. He does not appear to be in any acute distress. Presenting symptoms (see HPI) and exam as documented above.  Exam today consistent with both corneal abrasion and upper lid hordeolum in the patient's RIGHT eye.  Treating with a 5-day course of ERY ophthalmic ointment.  Patient advised to apply warm compresses to his eye to help soothe eye and promote improvement of his current symptoms.  He was encouraged to avoid touching/rubbing his eye.  If not improving in the next couple of days, patient was advised to see his eye doctor for further evaluation.  I have reviewed the follow up and strict return precautions for any new or worsening symptoms. Patient is aware of symptoms that would be deemed urgent/emergent, and would thus require further evaluation either here or in the  emergency department. At the time of discharge, he verbalized understanding and consent with the discharge plan as it was reviewed with him. All questions were fielded by provider and/or clinic staff prior to patient discharge.    Final Clinical Impressions / Urgent Care Diagnoses:   Final diagnoses:  Abrasion of right cornea, initial encounter  Hordeolum externum of right upper eyelid    New Prescriptions:  [place] Controlled Substance Registry consulted? Not Applicable  Meds ordered this encounter  Medications  . erythromycin ophthalmic ointment    Sig: Place a 1/2 inch ribbon of ointment into the lower eyelid QID x 5 days.    Dispense:  1 g    Refill:  0    Recommended Follow up Care:  Patient encouraged to follow up with the following provider within the specified time frame, or sooner as dictated by the severity of his symptoms. As always, he was instructed that for any urgent/emergent care needs, he should seek care either here or in the emergency department  for more immediate evaluation.  Follow-up Information    [place] In 1 week.   Why: General reassessment of symptoms if not improving Contact information: [address] [place] [address] [place] [address] [phone]         NOTE: This note was prepared using Dragon dictation software along with smaller phrase technology. Despite my best ability to proofread, there is the potential that transcriptional errors may still occur from this process, and are completely unintentional.    Karen Kitchens, NP 12/06/19 [phone]

## 2021-01-24 ENCOUNTER — Other Ambulatory Visit: Payer: Self-pay

## 2021-01-24 ENCOUNTER — Ambulatory Visit (INDEPENDENT_AMBULATORY_CARE_PROVIDER_SITE_OTHER): Payer: 59

## 2021-01-24 ENCOUNTER — Ambulatory Visit
Admission: EM | Admit: 2021-01-24 | Discharge: 2021-01-24 | Disposition: A | Discharge status: Home / Self Care | Payer: 59 | Attending: Family Medicine | Admitting: Family Medicine

## 2021-01-24 DIAGNOSIS — M62838 Other muscle spasm: Secondary | ICD-10-CM | POA: Diagnosis not present

## 2021-01-24 DIAGNOSIS — M79621 Pain in right upper arm: Secondary | ICD-10-CM | POA: Diagnosis not present

## 2021-01-24 DIAGNOSIS — M25511 Pain in right shoulder: Secondary | ICD-10-CM | POA: Diagnosis not present

## 2021-01-24 MED ORDER — TIZANIDINE HCL 4 MG PO TABS: 4.0000 mg | ORAL_TABLET | Freq: Three times a day (TID) | ORAL | 0 refills | Status: AC | PRN

## 2021-01-24 MED ORDER — MELOXICAM 15 MG PO TABS: 15.0000 mg | ORAL_TABLET | Freq: Every day | ORAL | 0 refills | Status: AC | PRN

## 2021-01-24 NOTE — Discharge Instructions (Signed)
Rest.  Medication as prescribed.  If this does not improve or worsens >>> Please call [facility] clinic Orthopedics [phone]) OR [facility] [phone]) for an appt

## 2021-01-24 NOTE — ED Provider Notes (Signed)
[facility]    CSN: [id number] Arrival date & time: 01/24/21  0817      History   Chief Complaint Chief Complaint  Patient presents with   Arm Pain   HPI 55 year old male presents with the above complaint.  Patient reports pain of the right upper arm and shoulder.  He has had this for the past 2.5 weeks.  No known fall, trauma, injury.  He does have a relatively physical job as he works for the city of [employer].  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  He does not recall a recent event that would have resulted in his current discomfort.  He states that his pain is 7/10 in severity.  He states that it feels similar to a muscle spasm.  He has taken over-the-counter analgesics and topical patches without resolution.  No reports of radicular symptoms at this time.  No reports of neck pain.  Past Medical History:  Diagnosis Date   GERD (gastroesophageal reflux disease)    History of diverticulitis    Hyperlipidemia    Hypertension    Past Surgical History:  Procedure Laterality Date   NO PAST SURGERIES      Home Medications    Prior to Admission medications   Medication Sig Start Date End Date Taking? Authorizing Provider  aspirin 81 MG EC tablet Take 1 tablet by mouth daily.   Yes [provider]  diltiazem (CARDIZEM CD) 240 MG 24 hr capsule Take 240 mg by mouth daily. 11/09/19  Yes [provider]  esomeprazole (NEXIUM) 20 MG capsule Take 1 capsule by mouth daily.   Yes [provider]  meloxicam (MOBIC) 15 MG tablet Take 1 tablet (15 mg total) by mouth daily as needed for pain. 01/24/21  Yes Broly Hatfield G, DO  sildenafil (VIAGRA) 100 MG tablet TAKE 1 TABLET BY MOUTH ONCE DAILY AS NEEDED FOR ERECTILE DYSFUNCTION UP TO FOR 30 DAYS 10/14/19  Yes [provider]  tiZANidine (ZANAFLEX) 4 MG tablet Take 1 tablet (4 mg total) by mouth every 8 (eight) hours as needed for muscle spasms. 01/24/21  Yes Tamala Manzer G, DO  losartan (COZAAR)  50 MG tablet Take 50 mg by mouth daily. 01/02/21   [provider]  metoprolol succinate (TOPROL-XL) 50 MG 24 hr tablet Take 50 mg by mouth daily. 12/05/20   [provider]    Family History Family History  Problem Relation Age of Onset   Diabetes Mother    Hypertension Mother    Heart attack Father    Hypertension Father     Social History Social History   Tobacco Use   Smoking status: Every Day    Packs/day: 0.50    Years: 20.00    Pack years: 10.00    Types: Cigarettes   Smokeless tobacco: Never  Vaping Use   Vaping Use: Never used  Substance Use Topics   Alcohol use: Yes    Comment: rarely   Drug use: No     Allergies   Patient has no known allergies.   Review of Systems Review of Systems Per HPI  Physical Exam Triage Vital Signs ED Triage Vitals  Enc Vitals Group     BP 01/24/21 0838 127/90     Pulse Rate 01/24/21 0838 67     Resp 01/24/21 0838 18     Temp 01/24/21 0838 98.4 F (36.9 C)     Temp Source 01/24/21 0838 Oral  SpO2 01/24/21 0838 97 %     Weight 01/24/21 0837 201 lb (91.2 kg)     Height --      Head Circumference --      Peak Flow --      Pain Score 01/24/21 0837 7     Pain Loc --      Pain Edu? --      Excl. in GC? --    Updated Vital Signs BP 127/90 (BP Location: Left Arm)   Pulse 67   Temp 98.4 F (36.9 C) (Oral)   Resp 18   Wt 91.2 kg   SpO2 97%   BMI 30.56 kg/m   Visual Acuity Right Eye Distance:   Left Eye Distance:   Bilateral Distance:    Right Eye Near:   Left Eye Near:    Bilateral Near:     Physical Exam Vitals and nursing note reviewed.  Constitutional:      General: He is not in acute distress.    Appearance: Normal appearance. He is not ill-appearing.  HENT:     Head: Normocephalic and atraumatic.  Eyes:     General:        Right eye: No discharge.        Left eye: No discharge.     Conjunctiva/sclera: Conjunctivae normal.  Cardiovascular:     Rate and Rhythm: Normal rate and  regular rhythm.  Pulmonary:     Effort: Pulmonary effort is normal.     Breath sounds: Normal breath sounds. No wheezing, rhonchi or rales.  Musculoskeletal:     Comments: Right shoulder -  Inspection reveals no abnormalities, atrophy or asymmetry. Palpation is normal with no tenderness over AC joint or bicipital groove. ROM is full in all planes. Rotator cuff strength normal throughout. + Hawkins. No painful arc and no drop arm sign.    Neurological:     Mental Status: He is alert.  Psychiatric:        Mood and Affect: Mood normal.        Behavior: Behavior normal.    UC Treatments / Results  Labs (all labs ordered are listed, but only abnormal results are displayed) Labs Reviewed - No data to display  EKG   Radiology DG Shoulder Right  Result Date: 01/24/2021 CLINICAL DATA:  Right upper arm pain that radiates to the shoulder. EXAM: RIGHT SHOULDER - 2+ VIEW COMPARISON:  None. FINDINGS: Right shoulder is located without a fracture. Visualized right ribs are intact. IMPRESSION: No acute abnormality to the right shoulder. Electronically Signed   By: Richarda Overlie M.D.   On: 01/24/2021 09:20    Procedures Procedures (including critical care time)  Medications Ordered in UC Medications - No data to display  Initial Impression / Assessment and Plan / UC Course  I have reviewed the triage vital signs and the nursing notes.  Pertinent labs & imaging results that were available during my care of the patient were reviewed by me and considered in my medical decision making (see chart for details).    55 year old male presents with right shoulder pain.  Patient with trapezius tension on exam.  Also had positive Hawkin's test.  X-ray was obtained and was independently reviewed by me.  Interpretation: Normal x-ray.  Treating with meloxicam and Zanaflex.  Information given regarding orthopedics.  Final Clinical Impressions(s) / UC Diagnoses   Final diagnoses:  Acute pain of right  shoulder  Trapezius muscle spasm     Discharge Instructions  Rest.  Medication as prescribed.  If this does not improve or worsens >>> Please call [facility] clinic Orthopedics [phone]) OR [facility] [phone]) for an appt      ED Prescriptions     Medication Sig Dispense Auth. Provider   meloxicam (MOBIC) 15 MG tablet Take 1 tablet (15 mg total) by mouth daily as needed for pain. 30 tablet Amberleigh Gerken G, DO   tiZANidine (ZANAFLEX) 4 MG tablet Take 1 tablet (4 mg total) by mouth every 8 (eight) hours as needed for muscle spasms. 30 tablet Tommie Sams, DO      PDMP not reviewed this encounter.   Everlene Other [place], [place] 01/24/21 [phone]

## 2021-01-24 NOTE — ED Triage Notes (Signed)
Pt c/o right upper arm pain that radiates to his shoulder that has been going on for the last 2 1/2 weeks, no known injury.

## 2021-09-19 ENCOUNTER — Other Ambulatory Visit (HOSPITAL_COMMUNITY): Payer: Self-pay | Admitting: Family Medicine

## 2021-09-19 ENCOUNTER — Other Ambulatory Visit: Payer: Self-pay | Admitting: Family Medicine

## 2021-09-19 DIAGNOSIS — F17210 Nicotine dependence, cigarettes, uncomplicated: Secondary | ICD-10-CM

## 2021-09-19 DIAGNOSIS — Z122 Encounter for screening for malignant neoplasm of respiratory organs: Secondary | ICD-10-CM

## 2021-10-06 ENCOUNTER — Ambulatory Visit
Admission: RE | Admit: 2021-10-06 | Discharge: 2021-10-06 | Disposition: A | Discharge status: Home / Self Care | Payer: 59 | Source: Ambulatory Visit | Attending: Family Medicine | Admitting: Family Medicine

## 2021-10-06 DIAGNOSIS — F17210 Nicotine dependence, cigarettes, uncomplicated: Secondary | ICD-10-CM | POA: Insufficient documentation

## 2021-10-06 DIAGNOSIS — Z122 Encounter for screening for malignant neoplasm of respiratory organs: Secondary | ICD-10-CM | POA: Diagnosis present

## 2022-02-06 ENCOUNTER — Encounter: Payer: Self-pay | Admitting: *Deleted

## 2022-02-06 ENCOUNTER — Ambulatory Visit: Payer: 59 | Admitting: General Practice

## 2022-02-06 ENCOUNTER — Encounter
Admission: RE | Disposition: A | Discharge status: Home / Self Care | Payer: Self-pay | Source: Home / Self Care | Attending: Gastroenterology

## 2022-02-06 ENCOUNTER — Ambulatory Visit
Admission: RE | Admit: 2022-02-06 | Discharge: 2022-02-06 | Disposition: A | Discharge status: Home / Self Care | Payer: 59 | Attending: Gastroenterology | Admitting: Gastroenterology

## 2022-02-06 DIAGNOSIS — E785 Hyperlipidemia, unspecified: Secondary | ICD-10-CM | POA: Insufficient documentation

## 2022-02-06 DIAGNOSIS — F17210 Nicotine dependence, cigarettes, uncomplicated: Secondary | ICD-10-CM | POA: Diagnosis not present

## 2022-02-06 DIAGNOSIS — K219 Gastro-esophageal reflux disease without esophagitis: Secondary | ICD-10-CM | POA: Insufficient documentation

## 2022-02-06 DIAGNOSIS — I10 Essential (primary) hypertension: Secondary | ICD-10-CM | POA: Diagnosis not present

## 2022-02-06 DIAGNOSIS — K317 Polyp of stomach and duodenum: Secondary | ICD-10-CM | POA: Insufficient documentation

## 2022-02-06 DIAGNOSIS — R1013 Epigastric pain: Secondary | ICD-10-CM | POA: Diagnosis present

## 2022-02-06 DIAGNOSIS — K2950 Unspecified chronic gastritis without bleeding: Secondary | ICD-10-CM | POA: Insufficient documentation

## 2022-02-06 HISTORY — PX: ESOPHAGOGASTRODUODENOSCOPY (EGD) WITH PROPOFOL: SHX5813

## 2022-02-06 SURGERY — ESOPHAGOGASTRODUODENOSCOPY (EGD) WITH PROPOFOL
Anesthesia: General

## 2022-02-06 MED ORDER — SODIUM CHLORIDE 0.9 % IV SOLN
INTRAVENOUS | Status: DC
  Administered 2022-02-06: 1000 mL via INTRAVENOUS

## 2022-02-06 MED ORDER — LIDOCAINE HCL (CARDIAC) PF 100 MG/5ML IV SOSY
PREFILLED_SYRINGE | INTRAVENOUS | Status: DC | PRN
  Administered 2022-02-06: 100 mg via INTRAVENOUS

## 2022-02-06 MED ORDER — PROPOFOL 10 MG/ML IV BOLUS
INTRAVENOUS | Status: DC | PRN
  Administered 2022-02-06: 90 mg via INTRAVENOUS
  Administered 2022-02-06: 40 mg via INTRAVENOUS
  Administered 2022-02-06: 50 mg via INTRAVENOUS
  Administered 2022-02-06: 20 mg via INTRAVENOUS

## 2022-02-06 NOTE — H&P (Signed)
Outpatient short stay form Pre-procedure 02/06/2022  [name], MD  Primary Physician: [facility]  Reason for visit:  Dyspepsia  History of present illness:    56 y/o gentleman with history of hypertension and tobacco abuse here for EGD for dyspepsia. No blood thinners. No family history of GI malignancies. No significant abdominal or neck surgeries.    Current Facility-Administered Medications:    0.9 %  sodium chloride infusion, , Intravenous, Continuous, Mikolaj Woolstenhulme, Rossie Muskrat, MD  Medications Prior to Admission  Medication Sig Dispense Refill Last Dose   aspirin 81 MG EC tablet Take 1 tablet by mouth daily.   02/05/2022   diltiazem (CARDIZEM CD) 240 MG 24 hr capsule Take 240 mg by mouth daily.   02/05/2022   esomeprazole (NEXIUM) 20 MG capsule Take 1 capsule by mouth daily.   02/05/2022   losartan (COZAAR) 50 MG tablet Take 50 mg by mouth daily.   02/05/2022   meloxicam (MOBIC) 15 MG tablet Take 1 tablet (15 mg total) by mouth daily as needed for pain. 30 tablet 0 02/05/2022   metoprolol succinate (TOPROL-XL) 50 MG 24 hr tablet Take 50 mg by mouth daily.   02/05/2022   tiZANidine (ZANAFLEX) 4 MG tablet Take 1 tablet (4 mg total) by mouth every 8 (eight) hours as needed for muscle spasms. 30 tablet 0 02/05/2022   sildenafil (VIAGRA) 100 MG tablet TAKE 1 TABLET BY MOUTH ONCE DAILY AS NEEDED FOR ERECTILE DYSFUNCTION UP TO FOR 30 DAYS    at prn     No Known Allergies   Past Medical History:  Diagnosis Date   GERD (gastroesophageal reflux disease)    History of diverticulitis    Hyperlipidemia    Hypertension     Review of systems:  Otherwise negative.    Physical Exam  Gen: Alert, oriented. Appears stated age.  HEENT: PERRLA. Lungs: No respiratory distress CV: RRR Abd: soft, benign, no masses Ext: No edema    Planned procedures: Proceed with EGD. The patient understands the nature of the planned procedure, indications, risks, alternatives and  potential complications including but not limited to bleeding, infection, perforation, damage to internal organs and possible oversedation/side effects from anesthesia. The patient agrees and gives consent to proceed.  Please refer to procedure notes for findings, recommendations and patient disposition/instructions.     [name], MD [facility]

## 2022-02-06 NOTE — Transfer of Care (Signed)
Immediate Anesthesia Transfer of Care Note  Patient: Paul Grant  Procedure(s) Performed: ESOPHAGOGASTRODUODENOSCOPY (EGD) WITH PROPOFOL  Patient Location: PACU  Anesthesia Type:MAC  Level of Consciousness: drowsy  Airway & Oxygen Therapy: Patient Spontanous Breathing and Patient connected to nasal cannula oxygen  Post-op Assessment: Report given to RN and Post -op Vital signs reviewed and stable  Post vital signs: stable  Last Vitals:  Vitals Value Taken Time  BP 120/87 02/06/22 0922  Temp 35.9 C 02/06/22 0922  Pulse 75 02/06/22 0927  Resp 16 02/06/22 0927  SpO2 93 % 02/06/22 0927  Vitals shown include unvalidated device data.  Last Pain:  Vitals:   02/06/22 0922  TempSrc: Temporal  PainSc: Asleep         Complications: No notable events documented.

## 2022-02-06 NOTE — Op Note (Signed)
[facility] Patient Name: Paul Grant Procedure Date: 02/06/2022 8:59 AM MRN: [id number] Account #: [id number] Date of Birth: 1965/08/03 Admit Type: Outpatient Age: 56 Room: [facility] ENDO ROOM 1 Gender: Male Note Status: Finalized Instrument Name: Upper Endoscope [id number] Procedure:             Upper GI endoscopy Indications:           Dyspepsia Providers:             Andrey Farmer MD, MD Referring MD:          No Local Md, MD (Referring MD) Medicines:             Monitored Anesthesia Care Complications:         No immediate complications. Estimated blood loss:                         Minimal. Procedure:             Pre-Anesthesia Assessment:                        - Prior to the procedure, a History and Physical was                         performed, and patient medications and allergies were                         reviewed. The patient is competent. The risks and                         benefits of the procedure and the sedation options and                         risks were discussed with the patient. All questions                         were answered and informed consent was obtained.                         Patient identification and proposed procedure were                         verified by the physician, the nurse, the                         anesthesiologist, the anesthetist and the technician                         in the endoscopy suite. Mental Status Examination:                         alert and oriented. Airway Examination: normal                         oropharyngeal airway and neck mobility. Respiratory                         Examination: clear to auscultation. CV Examination:  normal. Prophylactic Antibiotics: The patient does not                         require prophylactic antibiotics. Prior                         Anticoagulants: The patient has taken no previous                          anticoagulant or antiplatelet agents. ASA Grade                         Assessment: II - A patient with mild systemic disease.                         After reviewing the risks and benefits, the patient                         was deemed in satisfactory condition to undergo the                         procedure. The anesthesia plan was to use monitored                         anesthesia care (MAC). Immediately prior to                         administration of medications, the patient was                         re-assessed for adequacy to receive sedatives. The                         heart rate, respiratory rate, oxygen saturations,                         blood pressure, adequacy of pulmonary ventilation, and                         response to care were monitored throughout the                         procedure. The physical status of the patient was                         re-assessed after the procedure.                        After obtaining informed consent, the endoscope was                         passed under direct vision. Throughout the procedure,                         the patient's blood pressure, pulse, and oxygen                         saturations were monitored continuously. The Endoscope  was introduced through the mouth, and advanced to the                         second part of duodenum. The upper GI endoscopy was                         accomplished without difficulty. The patient tolerated                         the procedure well. Findings:      The examined esophagus was normal.      Diffuse mild inflammation characterized by erythema was found in the       gastric antrum. Biopsies were taken with a cold forceps for Helicobacter       pylori testing. Estimated blood loss was minimal.      A single 4 mm sessile polyp with no bleeding and no stigmata of recent       bleeding was found on the lesser curvature of the gastric body. Biopsies        were taken with a cold forceps for histology. Estimated blood loss was       minimal.      The examined duodenum was normal. Impression:            - Normal esophagus.                        - Gastritis. Biopsied.                        - A single gastric polyp. Biopsied.                        - Normal examined duodenum. Recommendation:        - Discharge patient to home.                        - Resume previous diet.                        - Continue present medications.                        - Await pathology results.                        - Return to referring physician as previously                         scheduled. Procedure Code(s):     --- Professional ---                        [phone], Esophagogastroduodenoscopy, flexible,                         transoral; with biopsy, single or multiple Diagnosis Code(s):     --- Professional ---                        K29.70, Gastritis, unspecified, without bleeding                        K31.7,  Polyp of stomach and duodenum                        R10.13, Epigastric pain CPT copyright 2019 American Medical Association. All rights reserved. The codes documented in this report are preliminary and upon coder review may  be revised to meet current compliance requirements. Andrey Farmer MD, MD 02/06/2022 9:23:51 AM Number of Addenda: 0 Note Initiated On: 02/06/2022 8:59 AM Estimated Blood Loss:  Estimated blood loss was minimal.      [facility]

## 2022-02-06 NOTE — Anesthesia Preprocedure Evaluation (Signed)
Anesthesia Evaluation  Patient identified by MRN, date of birth, ID band Patient awake    Reviewed: Allergy & Precautions, NPO status , Patient's Chart, lab work & pertinent test results  History of Anesthesia Complications Negative for: history of anesthetic complications  Airway Mallampati: III  TM Distance: >3 FB Neck ROM: full    Dental  (+) Chipped   Pulmonary neg pulmonary ROS, neg shortness of breath, neg COPD, Current Smoker,    Pulmonary exam normal        Cardiovascular hypertension, (-) angina(-) CAD, (-) Past MI and (-) CABG negative cardio ROS Normal cardiovascular exam     Neuro/Psych negative neurological ROS  negative psych ROS   GI/Hepatic Neg liver ROS, GERD  ,  Endo/Other  negative endocrine ROS  Renal/GU negative Renal ROS  negative genitourinary   Musculoskeletal   Abdominal   Peds  Hematology negative hematology ROS (+)   Anesthesia Other Findings Past Medical History: No date: GERD (gastroesophageal reflux disease) No date: History of diverticulitis No date: Hyperlipidemia No date: Hypertension  Past Surgical History: No date: NO PAST SURGERIES  BMI    Body Mass Index: 31.22 kg/m      Reproductive/Obstetrics negative OB ROS                             Anesthesia Physical Anesthesia Plan  ASA: 2  Anesthesia Plan: General   Post-op Pain Management: Minimal or no pain anticipated   Induction: Intravenous  PONV Risk Score and Plan: 3 and Propofol infusion, TIVA and Ondansetron  Airway Management Planned: Nasal Cannula  Additional Equipment: None  Intra-op Plan:   Post-operative Plan:   Informed Consent: I have reviewed the patients History and Physical, chart, labs and discussed the procedure including the risks, benefits and alternatives for the proposed anesthesia with the patient or authorized representative who has indicated his/her  understanding and acceptance.     Dental advisory given  Plan Discussed with: CRNA and Surgeon  Anesthesia Plan Comments: (Discussed risks of anesthesia with patient, including possibility of difficulty with spontaneous ventilation under anesthesia necessitating airway intervention, PONV, and rare risks such as cardiac or respiratory or neurological events, and allergic reactions. Discussed the role of CRNA in patient's perioperative care. Patient understands.)        Anesthesia Quick Evaluation

## 2022-02-06 NOTE — Anesthesia Postprocedure Evaluation (Signed)
Anesthesia Post Note  Patient: Paul Grant  Procedure(s) Performed: ESOPHAGOGASTRODUODENOSCOPY (EGD) WITH PROPOFOL  Patient location during evaluation: Endoscopy Anesthesia Type: General Level of consciousness: awake and alert Pain management: pain level controlled Vital Signs Assessment: post-procedure vital signs reviewed and stable Respiratory status: spontaneous breathing, nonlabored ventilation, respiratory function stable and patient connected to nasal cannula oxygen Cardiovascular status: blood pressure returned to baseline and stable Postop Assessment: no apparent nausea or vomiting Anesthetic complications: no   No notable events documented.   Last Vitals:  Vitals:   02/06/22 0922 02/06/22 0942  BP:    Pulse:    Resp:    Temp: (!) 35.9 C   SpO2:  99%    Last Pain:  Vitals:   02/06/22 0942  TempSrc:   PainSc: 0-No pain                 Stephanie Coup

## 2022-02-06 NOTE — Interval H&P Note (Signed)
History and Physical Interval Note:  02/06/2022 9:02 AM  Paul Grant  has presented today for surgery, with the diagnosis of DYSPEPSIA.  The various methods of treatment have been discussed with the patient and family. After consideration of risks, benefits and other options for treatment, the patient has consented to  Procedure(s): ESOPHAGOGASTRODUODENOSCOPY (EGD) WITH PROPOFOL (N/A) as a surgical intervention.  The patient's history has been reviewed, patient examined, no change in status, stable for surgery.  I have reviewed the patient's chart and labs.  Questions were answered to the patient's satisfaction.     [name]  Ok to proceed with EGD

## 2022-02-07 ENCOUNTER — Encounter: Payer: Self-pay | Admitting: Gastroenterology

## 2022-02-07 LAB — SURGICAL PATHOLOGY

## 2022-05-08 ENCOUNTER — Ambulatory Visit
Admission: EM | Admit: 2022-05-08 | Discharge: 2022-05-08 | Disposition: A | Discharge status: Home / Self Care | Payer: 59 | Attending: Emergency Medicine | Admitting: Emergency Medicine

## 2022-05-08 DIAGNOSIS — J069 Acute upper respiratory infection, unspecified: Secondary | ICD-10-CM

## 2022-05-08 MED ORDER — IPRATROPIUM BROMIDE 0.06 % NA SOLN: 2.0000 | Freq: Four times a day (QID) | NASAL | 12 refills | Status: DC

## 2022-05-08 MED ORDER — PROMETHAZINE-DM 6.25-15 MG/5ML PO SYRP: 5.0000 mL | ORAL_SOLUTION | Freq: Four times a day (QID) | ORAL | 0 refills | Status: DC | PRN

## 2022-05-08 MED ORDER — BENZONATATE 100 MG PO CAPS: 200.0000 mg | ORAL_CAPSULE | Freq: Three times a day (TID) | ORAL | 0 refills | Status: DC

## 2022-05-08 NOTE — ED Provider Notes (Signed)
[facility]    CSN: [id number] Arrival date & time: 05/08/22  1746      History   Chief Complaint Chief Complaint  Patient presents with   Cough    HPI Theodus Ran is a 56 y.o. male.   HPI  56 year old male here for evaluation of cough.  Patient reports that he has been experiencing a nonproductive cough for the last week and a half.  The cough is mainly at night when he lays down.  He denies any runny nose, nasal congestion, ear pain, sore throat, shortness breath, or wheezing.  No known sick contacts.  Past Medical History:  Diagnosis Date   GERD (gastroesophageal reflux disease)    History of diverticulitis    Hyperlipidemia    Hypertension     There are no problems to display for this patient.   Past Surgical History:  Procedure Laterality Date   ESOPHAGOGASTRODUODENOSCOPY (EGD) WITH PROPOFOL N/A 02/06/2022   Procedure: ESOPHAGOGASTRODUODENOSCOPY (EGD) WITH PROPOFOL;  Surgeon: Lesly Rubenstein, MD;  Location: [facility] ENDOSCOPY;  Service: Endoscopy;  Laterality: N/A;   NO PAST SURGERIES         Home Medications    Prior to Admission medications   Medication Sig Start Date End Date Taking? Authorizing Provider  aspirin 81 MG EC tablet Take 1 tablet by mouth daily.   Yes [provider]  benzonatate (TESSALON) 100 MG capsule Take 2 capsules (200 mg total) by mouth every 8 (eight) hours. 05/08/22  Yes Margarette Canada, NP  diltiazem (CARDIZEM CD) 240 MG 24 hr capsule Take 240 mg by mouth daily. 11/09/19  Yes [provider]  ipratropium (ATROVENT) 0.06 % nasal spray Place 2 sprays into both nostrils 4 (four) times daily. 05/08/22  Yes Margarette Canada, NP  losartan (COZAAR) 50 MG tablet Take 50 mg by mouth daily. 01/02/21  Yes [provider]  metoprolol succinate (TOPROL-XL) 50 MG 24 hr tablet Take 50 mg by mouth daily. 12/05/20  Yes [provider]  promethazine-dextromethorphan (PROMETHAZINE-DM) 6.25-15  MG/5ML syrup Take 5 mLs by mouth 4 (four) times daily as needed. 05/08/22  Yes Margarette Canada, NP  tiZANidine (ZANAFLEX) 4 MG tablet Take 1 tablet (4 mg total) by mouth every 8 (eight) hours as needed for muscle spasms. 01/24/21  Yes Cook, Jayce G, DO  esomeprazole (NEXIUM) 20 MG capsule Take 1 capsule by mouth daily.    [provider]  meloxicam (MOBIC) 15 MG tablet Take 1 tablet (15 mg total) by mouth daily as needed for pain. 01/24/21   Coral Spikes, DO  sildenafil (VIAGRA) 100 MG tablet TAKE 1 TABLET BY MOUTH ONCE DAILY AS NEEDED FOR ERECTILE DYSFUNCTION UP TO FOR 30 DAYS 10/14/19   [provider]    Family History Family History  Problem Relation Age of Onset   Diabetes Mother    Hypertension Mother    Heart attack Father    Hypertension Father     Social History Social History   Tobacco Use   Smoking status: Every Day    Packs/day: 0.50    Years: 20.00    Total pack years: 10.00    Types: Cigarettes   Smokeless tobacco: Never  Vaping Use   Vaping Use: Never used  Substance Use Topics   Alcohol use: Yes    Comment: rarely   Drug use: No     Allergies   Patient has no known allergies.   Review of Systems Review of Systems  Constitutional:  Negative for fever.  HENT:  Negative for congestion, ear pain, rhinorrhea and sore throat.   Respiratory:  Positive for cough. Negative for shortness of breath and wheezing.      Physical Exam Triage Vital Signs ED Triage Vitals  Enc Vitals Group     BP 05/08/22 1825 (!) 137/93     Pulse Rate 05/08/22 1825 77     Resp --      Temp 05/08/22 1825 98.3 F (36.8 C)     Temp Source 05/08/22 1825 Oral     SpO2 05/08/22 1825 95 %     Weight 05/08/22 1824 211 lb (95.7 kg)     Height 05/08/22 1824 5\' 9"  (1.753 m)     Head Circumference --      Peak Flow --      Pain Score 05/08/22 1824 0     Pain Loc --      Pain Edu? --      Excl. in GC? --    No data found.  Updated Vital Signs BP (!) 137/93 (BP  Location: Right Arm)   Pulse 77   Temp 98.3 F (36.8 C) (Oral)   Ht 5\' 9"  (1.753 m)   Wt 211 lb (95.7 kg)   SpO2 95%   BMI 31.16 kg/m   Visual Acuity Right Eye Distance:   Left Eye Distance:   Bilateral Distance:    Right Eye Near:   Left Eye Near:    Bilateral Near:     Physical Exam Vitals and nursing note reviewed.  Constitutional:      Appearance: Normal appearance. He is not ill-appearing.  HENT:     Head: Normocephalic and atraumatic.     Right Ear: Tympanic membrane, ear canal and external ear normal. There is no impacted cerumen.     Left Ear: Tympanic membrane, ear canal and external ear normal. There is no impacted cerumen.     Nose: Congestion and rhinorrhea present.     Comments: His mucosa is erythematous and edematous with clear rhinorrhea in both nares.    Mouth/Throat:     Mouth: Mucous membranes are moist.     Pharynx: Oropharynx is clear. Posterior oropharyngeal erythema present. No oropharyngeal exudate.     Comments: Posterior oropharynx is erythematous and mildly injected with clear postnasal drip. Cardiovascular:     Rate and Rhythm: Normal rate and regular rhythm.     Pulses: Normal pulses.     Heart sounds: Normal heart sounds. No murmur heard.    No friction rub. No gallop.  Pulmonary:     Effort: Pulmonary effort is normal.     Breath sounds: Normal breath sounds. No wheezing, rhonchi or rales.  Musculoskeletal:     Cervical back: Normal range of motion and neck supple.  Lymphadenopathy:     Cervical: No cervical adenopathy.  Skin:    General: Skin is dry.     Capillary Refill: Capillary refill takes less than 2 seconds.     Findings: No erythema or rash.  Neurological:     General: No focal deficit present.     Mental Status: He is alert and oriented to person, place, and time.  Psychiatric:        Mood and Affect: Mood normal.        Behavior: Behavior normal.        Thought Content: Thought content normal.        Judgment: Judgment  normal.      UC  Treatments / Results  Labs (all labs ordered are listed, but only abnormal results are displayed) Labs Reviewed - No data to display  EKG   Radiology No results found.  Procedures Procedures (including critical care time)  Medications Ordered in UC Medications - No data to display  Initial Impression / Assessment and Plan / UC Course  I have reviewed the triage vital signs and the nursing notes.  Pertinent labs & imaging results that were available during my care of the patient were reviewed by me and considered in my medical decision making (see chart for details).   Patient is a nontoxic-appearing 56 year old male here for evaluation of a week and a half worth of cough that mainly happens at night when he lays down.  He denies any significant cough during the day.  She also denies runny nose, nasal congestion, ear pain, sore throat, shortness breath, or wheezing.  His physical exam does reveal erythema and edema of his nasal passages with clear rhinorrhea.  There is also clear postnasal drip present in the posterior oropharynx.  Nothing is purulent looking.  His cardiopulmonary exam bisque lung sounds in all fields.  I suspect that he is experiencing some postnasal drip and this intensifies when he lays down that is why he has an increased cough at night.  I will discharge him home on Atrovent nasal spray to help rest the mucus production and postnasal drip along with Tessalon Perles and Promethazine DM cough syrup.  Return precautions reviewed.   Final Clinical Impressions(s) / UC Diagnoses   Final diagnoses:  Viral URI with cough     Discharge Instructions      Use the Atrovent nasal spray, 2 squirts in each nostril every 6 hours, as needed for runny nose and postnasal drip.  Use the Tessalon Perles every 8 hours during the day.  Take them with a small sip of water.  They may give you some numbness to the base of your tongue or a metallic taste in your  mouth, this is normal.  Use the Promethazine DM cough syrup at bedtime for cough and congestion.  It will make you drowsy so do not take it during the day.  Return for reevaluation or see your primary care provider for any new or worsening symptoms.      ED Prescriptions     Medication Sig Dispense Auth. Provider   benzonatate (TESSALON) 100 MG capsule Take 2 capsules (200 mg total) by mouth every 8 (eight) hours. 21 capsule Becky Augusta, NP   ipratropium (ATROVENT) 0.06 % nasal spray Place 2 sprays into both nostrils 4 (four) times daily. 15 mL Becky Augusta, NP   promethazine-dextromethorphan (PROMETHAZINE-DM) 6.25-15 MG/5ML syrup Take 5 mLs by mouth 4 (four) times daily as needed. 118 mL Becky Augusta, NP      PDMP not reviewed this encounter.   Becky Augusta, NP 05/08/22 1836

## 2022-05-08 NOTE — Discharge Instructions (Signed)

## 2022-05-08 NOTE — ED Triage Notes (Signed)
Pt c/o cough x1.5 week, worse at night

## 2022-07-06 ENCOUNTER — Ambulatory Visit
Admission: EM | Admit: 2022-07-06 | Discharge: 2022-07-06 | Disposition: A | Discharge status: Home / Self Care | Payer: 59 | Attending: Emergency Medicine | Admitting: Emergency Medicine

## 2022-07-06 ENCOUNTER — Encounter: Payer: Self-pay | Admitting: Emergency Medicine

## 2022-07-06 DIAGNOSIS — U071 COVID-19: Secondary | ICD-10-CM | POA: Diagnosis not present

## 2022-07-06 LAB — SARS CORONAVIRUS 2 BY RT PCR: SARS Coronavirus 2 by RT PCR: POSITIVE — AB

## 2022-07-06 MED ORDER — IPRATROPIUM BROMIDE 0.06 % NA SOLN: 2.0000 | Freq: Four times a day (QID) | NASAL | 12 refills | Status: AC

## 2022-07-06 MED ORDER — BENZONATATE 100 MG PO CAPS: 200.0000 mg | ORAL_CAPSULE | Freq: Three times a day (TID) | ORAL | 0 refills | Status: AC

## 2022-07-06 MED ORDER — PROMETHAZINE-DM 6.25-15 MG/5ML PO SYRP: 5.0000 mL | ORAL_SOLUTION | Freq: Four times a day (QID) | ORAL | 0 refills | Status: AC | PRN

## 2022-07-06 MED ORDER — NIRMATRELVIR/RITONAVIR (PAXLOVID)TABLET: 3.0000 | ORAL_TABLET | Freq: Two times a day (BID) | ORAL | 0 refills | Status: AC

## 2022-07-06 NOTE — Discharge Instructions (Signed)
You tested positive for COVID so you need to quarantine for 5 days from onset of your symptoms.  After 5 days you can break quarantine if your symptoms have improved and you have not had a fever for 24 hours without taking Tylenol or ibuprofen.  Take over-the-counter Tylenol and ibuprofen according to the package instructions as needed for fever or pain.  Take the Paxlovid twice daily for 5 days for treatment of COVID-19.  Do not take your sildenafil while you are taking this medication.  Use the Atrovent nasal spray, 2 squirts in each nostril every 6 hours, as needed for runny nose and postnasal drip.  Use the Tessalon Perles every 8 hours during the day.  Take them with a small sip of water.  They may give you some numbness to the base of your tongue or a metallic taste in your mouth, this is normal.  Use the Promethazine DM cough syrup at bedtime for cough and congestion.  It will make you drowsy so do not take it during the day.  If you develop shortness of breath, especially at rest, feel as though you cannot catch her breath, you are unable to speak in full sentences, or your lips begin turning blue you need to call 911 and go to the ER.

## 2022-07-06 NOTE — ED Provider Notes (Signed)
[facility]    CSN: [id number] Arrival date & time: 07/06/22  1917      History   Chief Complaint Chief Complaint  Patient presents with   Cough   Chills    HPI Paul Grant is a 56 y.o. male.   HPI  56 year old male here for evaluation of respiratory complaints.  Patient reports that his symptoms began in earnest this morning and they consist of subjective fever, headache, chills, runny nose and nasal congestion, and nonproductive cough.  He denies any ear pain, sore throat, shortness breath, or wheezing.  He took a home COVID test and was positive.  Past Medical History:  Diagnosis Date   GERD (gastroesophageal reflux disease)    History of diverticulitis    Hyperlipidemia    Hypertension     There are no problems to display for this patient.   Past Surgical History:  Procedure Laterality Date   ESOPHAGOGASTRODUODENOSCOPY (EGD) WITH PROPOFOL N/A 02/06/2022   Procedure: ESOPHAGOGASTRODUODENOSCOPY (EGD) WITH PROPOFOL;  Surgeon: [name], MD;  Location: [facility] END[facility]COPY;  Service: Endoscopy;  Laterality: N/A;   NO PAST SURGERIES         Home Medications    Prior to Admission medications   Medication Sig Start Date End Date Taking? Authorizing Provider  benzonatate (TESSALON) 100 MG capsule Take 2 capsules (200 mg total) by mouth every 8 (eight) hours. 07/06/22  Yes Becky Augusta, NP  ipratropium (ATROVENT) 0.06 % nasal spray Place 2 sprays into both nostrils 4 (four) times daily. 07/06/22  Yes Becky Augusta, NP  nirmatrelvir/ritonavir (PAXLOVID) 20 x 150 MG & 10 x 100MG  TABS Take 3 tablets by mouth 2 (two) times daily for 5 days. 07/06/22 07/11/22 Yes 09/09/22, NP  promethazine-dextromethorphan (PROMETHAZINE-DM) 6.25-15 MG/5ML syrup Take 5 mLs by mouth 4 (four) times daily as needed. 07/06/22  Yes 07/08/22, NP  aspirin 81 MG EC tablet Take 1 tablet by mouth daily.    [provider]  diltiazem (CARDIZEM CD) 240 MG  24 hr capsule Take 240 mg by mouth daily. 11/09/19   [provider]  esomeprazole (NEXIUM) 20 MG capsule Take 1 capsule by mouth daily.    [provider]  losartan (COZAAR) 50 MG tablet Take 50 mg by mouth daily. 01/02/21   [provider]  meloxicam (MOBIC) 15 MG tablet Take 1 tablet (15 mg total) by mouth daily as needed for pain. 01/24/21   01/26/21, DO  metoprolol succinate (TOPROL-XL) 50 MG 24 hr tablet Take 50 mg by mouth daily. 12/05/20   [provider]  sildenafil (VIAGRA) 100 MG tablet TAKE 1 TABLET BY MOUTH ONCE DAILY AS NEEDED FOR ERECTILE DYSFUNCTION UP TO FOR 30 DAYS 10/14/19   [provider]  tiZANidine (ZANAFLEX) 4 MG tablet Take 1 tablet (4 mg total) by mouth every 8 (eight) hours as needed for muscle spasms. 01/24/21   01/26/21, DO    Family History Family History  Problem Relation Age of Onset   Diabetes Mother    Hypertension Mother    Heart attack Father    Hypertension Father     Social History Social History   Tobacco Use   Smoking status: Every Day    Packs/day: 0.50    Years: 20.00    Total pack years: 10.00    Types: Cigarettes   Smokeless tobacco: Never  Vaping Use   Vaping Use: Never used  Substance Use Topics   Alcohol use:  Yes    Comment: rarely   Drug use: No     Allergies   Patient has no known allergies.   Review of Systems Review of Systems  Constitutional:  Positive for chills and fever.  HENT:  Positive for congestion and rhinorrhea. Negative for ear pain and sore throat.   Respiratory:  Positive for cough. Negative for shortness of breath and wheezing.      Physical Exam Triage Vital Signs ED Triage Vitals  Enc Vitals Group     BP 07/06/22 1946 126/89     Pulse Rate 07/06/22 1946 92     Resp 07/06/22 1946 15     Temp 07/06/22 1946 98.6 F (37 C)     Temp Source 07/06/22 1946 Oral     SpO2 07/06/22 1946 96 %     Weight 07/06/22 1943 210 lb (95.3 kg)     Height 07/06/22  1943 5\' 9"  (1.753 m)     Head Circumference --      Peak Flow --      Pain Score 07/06/22 1943 6     Pain Loc --      Pain Edu? --      Excl. in GC? --    No data found.  Updated Vital Signs BP 126/89 (BP Location: Right Arm)   Pulse 92   Temp 98.6 F (37 C) (Oral)   Resp 15   Ht 5\' 9"  (1.753 m)   Wt 210 lb (95.3 kg)   SpO2 96%   BMI 31.01 kg/m   Visual Acuity Right Eye Distance:   Left Eye Distance:   Bilateral Distance:    Right Eye Near:   Left Eye Near:    Bilateral Near:     Physical Exam Vitals and nursing note reviewed.  Constitutional:      Appearance: Normal appearance. He is not ill-appearing.  HENT:     Head: Normocephalic and atraumatic.     Right Ear: Tympanic membrane, ear canal and external ear normal. There is no impacted cerumen.     Left Ear: Tympanic membrane, ear canal and external ear normal. There is no impacted cerumen.     Nose: Congestion and rhinorrhea present.     Comments: Nasal mucosa is erythematous and edematous.  Clear discharge in both nares.    Mouth/Throat:     Mouth: Mucous membranes are moist.     Pharynx: Oropharynx is clear. No posterior oropharyngeal erythema.  Cardiovascular:     Rate and Rhythm: Normal rate and regular rhythm.     Pulses: Normal pulses.     Heart sounds: Normal heart sounds. No murmur heard.    No friction rub. No gallop.  Pulmonary:     Effort: Pulmonary effort is normal.     Breath sounds: Normal breath sounds. No wheezing, rhonchi or rales.  Musculoskeletal:     Cervical back: Normal range of motion and neck supple.  Skin:    General: Skin is warm and dry.     Capillary Refill: Capillary refill takes less than 2 seconds.     Findings: No erythema or rash.  Neurological:     General: No focal deficit present.     Mental Status: He is alert and oriented to person, place, and time.  Psychiatric:        Mood and Affect: Mood normal.        Behavior: Behavior normal.        Thought Content:  Thought content normal.  Judgment: Judgment normal.      UC Treatments / Results  Labs (all labs ordered are listed, but only abnormal results are displayed) Labs Reviewed  SARS CORONAVIRUS 2 BY RT PCR - Abnormal; Notable for the following components:      Result Value   SARS Coronavirus 2 by RT PCR POSITIVE (*)    All other components within normal limits    EKG   Radiology No results found.  Procedures Procedures (including critical care time)  Medications Ordered in UC Medications - No data to display  Initial Impression / Assessment and Plan / UC Course  I have reviewed the triage vital signs and the nursing notes.  Pertinent labs & imaging results that were available during my care of the patient were reviewed by me and considered in my medical decision making (see chart for details).   Patient is a pleasant, nontoxic-appearing 56 year old male here for evaluation of respiratory symptoms as outlined in HPI above.  Patient is not in any acute distress and he is able to speak in full sentences without dyspnea or tachypnea.  His upper respiratory exam does reveal inflamed nasal mucosa and clear rhinorrhea bilaterally.  His lung sounds are clear to auscultation all fields.  His exam is consistent with an upper respiratory infection.  This coupled with his positive home COVID test I will discharge him home with a diagnosis of COVID-19 and treated with Paxlovid twice daily for 5 days.  I have advised him that he needs to not take his Viagra while he is taking the Paxlovid.  I will also prescribe Atrovent nasal spray, Tessalon Perles, Promethazine DM cough syrup.  We discussed quarantine and ER precautions.  Work note provided.   Final Clinical Impressions(s) / UC Diagnoses   Final diagnoses:  COVID-19     Discharge Instructions      You tested positive for COVID so you need to quarantine for 5 days from onset of your symptoms.  After 5 days you can break quarantine  if your symptoms have improved and you have not had a fever for 24 hours without taking Tylenol or ibuprofen.  Take over-the-counter Tylenol and ibuprofen according to the package instructions as needed for fever or pain.  Take the Paxlovid twice daily for 5 days for treatment of COVID-19.  Do not take your sildenafil while you are taking this medication.  Use the Atrovent nasal spray, 2 squirts in each nostril every 6 hours, as needed for runny nose and postnasal drip.  Use the Tessalon Perles every 8 hours during the day.  Take them with a small sip of water.  They may give you some numbness to the base of your tongue or a metallic taste in your mouth, this is normal.  Use the Promethazine DM cough syrup at bedtime for cough and congestion.  It will make you drowsy so do not take it during the day.  If you develop shortness of breath, especially at rest, feel as though you cannot catch her breath, you are unable to speak in full sentences, or your lips begin turning blue you need to call 911 and go to the ER.     ED Prescriptions     Medication Sig Dispense Auth. Provider   benzonatate (TESSALON) 100 MG capsule Take 2 capsules (200 mg total) by mouth every 8 (eight) hours. 21 capsule Becky Augusta, NP   ipratropium (ATROVENT) 0.06 % nasal spray Place 2 sprays into both nostrils 4 (four) times daily. 15  mL Becky Augustayan, Harlan Ervine, NP   nirmatrelvir/ritonavir (PAXLOVID) 20 x 150 MG & 10 x 100MG  TABS Take 3 tablets by mouth 2 (two) times daily for 5 days. 30 tablet Becky Augustayan, Charletha Dalpe, NP   promethazine-dextromethorphan (PROMETHAZINE-DM) 6.25-15 MG/5ML syrup Take 5 mLs by mouth 4 (four) times daily as needed. 118 mL Becky Augustayan, Nazar Kuan, NP      PDMP not reviewed this encounter.   Becky Augustayan, Zareah Hunzeker, NP 07/06/22 2020

## 2022-07-06 NOTE — ED Triage Notes (Signed)
Patient c/o cough, congestion, and chills that started yesterday.  Patient took covid test today at home and was positive.

## 2022-09-11 ENCOUNTER — Other Ambulatory Visit: Payer: Self-pay | Admitting: Internal Medicine

## 2022-09-11 DIAGNOSIS — I2510 Atherosclerotic heart disease of native coronary artery without angina pectoris: Secondary | ICD-10-CM

## 2022-09-11 DIAGNOSIS — I429 Cardiomyopathy, unspecified: Secondary | ICD-10-CM

## 2022-09-11 DIAGNOSIS — R9439 Abnormal result of other cardiovascular function study: Secondary | ICD-10-CM

## 2022-10-04 ENCOUNTER — Telehealth (HOSPITAL_COMMUNITY): Payer: Self-pay | Admitting: *Deleted

## 2022-10-04 MED ORDER — IVABRADINE HCL 5 MG PO TABS: ORAL_TABLET | ORAL | 0 refills | Status: AC

## 2022-10-04 MED ORDER — METOPROLOL TARTRATE 100 MG PO TABS: ORAL_TABLET | ORAL | 0 refills | Status: AC

## 2022-10-04 NOTE — Telephone Encounter (Signed)
Reaching out to patient to offer assistance regarding upcoming cardiac imaging study; pt verbalizes understanding of appt date/time, parking situation and where to check in, pre-test NPO status and medications ordered, and verified current allergies; name and call back number provided for further questions should they arise  Paul Clement RN Navigator Cardiac Imaging [facility] [phone] office [phone] cell  Patient to hold his daily BP medications and take 100mg  metoprolol tartrate and 10mg  ivabradine two hours prior to his cardiac CT scan.

## 2022-10-08 ENCOUNTER — Ambulatory Visit
Admission: RE | Admit: 2022-10-08 | Discharge: 2022-10-08 | Disposition: A | Discharge status: Home / Self Care | Payer: 59 | Source: Ambulatory Visit | Attending: Internal Medicine | Admitting: Internal Medicine

## 2022-10-08 ENCOUNTER — Other Ambulatory Visit (HOSPITAL_COMMUNITY): Payer: Self-pay

## 2022-10-08 DIAGNOSIS — I429 Cardiomyopathy, unspecified: Secondary | ICD-10-CM | POA: Insufficient documentation

## 2022-10-08 DIAGNOSIS — I2510 Atherosclerotic heart disease of native coronary artery without angina pectoris: Secondary | ICD-10-CM | POA: Insufficient documentation

## 2022-10-08 DIAGNOSIS — R9439 Abnormal result of other cardiovascular function study: Secondary | ICD-10-CM

## 2022-10-08 MED ORDER — NITROGLYCERIN 0.4 MG SL SUBL
0.8000 mg | SUBLINGUAL_TABLET | Freq: Once | SUBLINGUAL | Status: AC
  Administered 2022-10-08: 0.8 mg via SUBLINGUAL

## 2022-10-08 MED ORDER — IOHEXOL 350 MG/ML SOLN
100.0000 mL | Freq: Once | INTRAVENOUS | Status: AC | PRN
  Administered 2022-10-08: 100 mL via INTRAVENOUS

## 2022-10-08 NOTE — Progress Notes (Signed)
Patient tolerated procedure well. Ambulate w/o difficulty. Denies light headedness or being dizzy. Encouraged to drink extra water today and reasoning explained. Verbalized understanding. All questions answered. ABC intact. No further needs. Discharge from procedure area w/o issues.   

## 2023-03-27 ENCOUNTER — Other Ambulatory Visit: Payer: Self-pay | Admitting: Gastroenterology

## 2023-03-27 DIAGNOSIS — R1013 Epigastric pain: Secondary | ICD-10-CM

## 2023-03-29 ENCOUNTER — Encounter
Admission: RE | Admit: 2023-03-29 | Discharge: 2023-03-29 | Disposition: A | Discharge status: Home / Self Care | Payer: 59 | Source: Ambulatory Visit | Attending: Gastroenterology | Admitting: Gastroenterology

## 2023-03-29 DIAGNOSIS — R1013 Epigastric pain: Secondary | ICD-10-CM | POA: Diagnosis present

## 2023-03-29 MED ORDER — TECHNETIUM TC 99M SULFUR COLLOID
2.0000 | Freq: Once | INTRAVENOUS | Status: AC | PRN
  Administered 2023-03-29: 2.25 via ORAL

## 2023-06-04 ENCOUNTER — Encounter: Payer: Self-pay | Admitting: *Deleted

## 2023-07-05 ENCOUNTER — Encounter: Payer: Self-pay | Admitting: *Deleted

## 2023-07-08 ENCOUNTER — Other Ambulatory Visit: Payer: Self-pay

## 2023-07-08 ENCOUNTER — Ambulatory Visit: Payer: 59 | Admitting: Registered Nurse

## 2023-07-08 ENCOUNTER — Encounter: Payer: Self-pay | Admitting: *Deleted

## 2023-07-08 ENCOUNTER — Ambulatory Visit
Admission: RE | Admit: 2023-07-08 | Discharge: 2023-07-08 | Disposition: A | Discharge status: Home / Self Care | Payer: 59 | Attending: Gastroenterology | Admitting: Gastroenterology

## 2023-07-08 ENCOUNTER — Encounter
Admission: RE | Disposition: A | Discharge status: Home / Self Care | Payer: Self-pay | Source: Home / Self Care | Attending: Gastroenterology

## 2023-07-08 DIAGNOSIS — Z1211 Encounter for screening for malignant neoplasm of colon: Secondary | ICD-10-CM | POA: Diagnosis present

## 2023-07-08 DIAGNOSIS — K219 Gastro-esophageal reflux disease without esophagitis: Secondary | ICD-10-CM | POA: Diagnosis not present

## 2023-07-08 DIAGNOSIS — Z79899 Other long term (current) drug therapy: Secondary | ICD-10-CM | POA: Diagnosis not present

## 2023-07-08 DIAGNOSIS — D123 Benign neoplasm of transverse colon: Secondary | ICD-10-CM | POA: Insufficient documentation

## 2023-07-08 DIAGNOSIS — F17200 Nicotine dependence, unspecified, uncomplicated: Secondary | ICD-10-CM | POA: Diagnosis not present

## 2023-07-08 DIAGNOSIS — K5730 Diverticulosis of large intestine without perforation or abscess without bleeding: Secondary | ICD-10-CM | POA: Insufficient documentation

## 2023-07-08 DIAGNOSIS — E785 Hyperlipidemia, unspecified: Secondary | ICD-10-CM | POA: Diagnosis not present

## 2023-07-08 DIAGNOSIS — Z7982 Long term (current) use of aspirin: Secondary | ICD-10-CM | POA: Insufficient documentation

## 2023-07-08 DIAGNOSIS — I10 Essential (primary) hypertension: Secondary | ICD-10-CM | POA: Diagnosis not present

## 2023-07-08 DIAGNOSIS — K640 First degree hemorrhoids: Secondary | ICD-10-CM | POA: Insufficient documentation

## 2023-07-08 HISTORY — DX: Male erectile dysfunction, unspecified: N52.9

## 2023-07-08 HISTORY — PX: COLONOSCOPY WITH PROPOFOL: SHX5780

## 2023-07-08 HISTORY — DX: Atherosclerotic heart disease of native coronary artery without angina pectoris: I25.10

## 2023-07-08 HISTORY — PX: POLYPECTOMY: SHX5525

## 2023-07-08 SURGERY — COLONOSCOPY WITH PROPOFOL
Anesthesia: General

## 2023-07-08 MED ORDER — PROPOFOL 10 MG/ML IV BOLUS
INTRAVENOUS | Status: AC
  Filled 2023-07-08: qty 20

## 2023-07-08 MED ORDER — LIDOCAINE HCL (PF) 2 % IJ SOLN
INTRAMUSCULAR | Status: AC
  Filled 2023-07-08: qty 5

## 2023-07-08 MED ORDER — PROPOFOL 1000 MG/100ML IV EMUL
INTRAVENOUS | Status: AC
  Filled 2023-07-08: qty 100

## 2023-07-08 MED ORDER — LIDOCAINE HCL (CARDIAC) PF 100 MG/5ML IV SOSY
PREFILLED_SYRINGE | INTRAVENOUS | Status: DC | PRN
  Administered 2023-07-08: 40 mg via INTRAVENOUS

## 2023-07-08 MED ORDER — PROPOFOL 500 MG/50ML IV EMUL
INTRAVENOUS | Status: DC | PRN
  Administered 2023-07-08: 100 ug/kg/min via INTRAVENOUS

## 2023-07-08 MED ORDER — SODIUM CHLORIDE 0.9 % IV SOLN: INTRAVENOUS | Status: DC

## 2023-07-08 MED ORDER — PROPOFOL 10 MG/ML IV BOLUS
INTRAVENOUS | Status: DC | PRN
  Administered 2023-07-08: 60 mg via INTRAVENOUS

## 2023-07-08 NOTE — Anesthesia Postprocedure Evaluation (Signed)
Anesthesia Post Note  Patient: Paul Grant  Procedure(s) Performed: COLONOSCOPY WITH PROPOFOL  Patient location during evaluation: Endoscopy Anesthesia Type: General Level of consciousness: awake and alert Pain management: pain level controlled Vital Signs Assessment: post-procedure vital signs reviewed and stable Respiratory status: spontaneous breathing, nonlabored ventilation, respiratory function stable and patient connected to nasal cannula oxygen Cardiovascular status: blood pressure returned to baseline and stable Postop Assessment: no apparent nausea or vomiting Anesthetic complications: no   No notable events documented.   Last Vitals:  Vitals:   07/08/23 1200 07/08/23 1210  BP:  (!) 122/94  Pulse: 80 66  Resp: 12 13  Temp:    SpO2: 98% 99%    Last Pain:  Vitals:   07/08/23 1210  TempSrc:   PainSc: 0-No pain                 Lenard Simmer

## 2023-07-08 NOTE — Anesthesia Preprocedure Evaluation (Signed)
Anesthesia Evaluation  Patient identified by MRN, date of birth, ID band Patient awake    Reviewed: Allergy & Precautions, NPO status , Patient's Chart, lab work & pertinent test results  History of Anesthesia Complications Negative for: history of anesthetic complications  Airway Mallampati: III  TM Distance: >3 FB Neck ROM: full    Dental  (+) Chipped, Dental Advidsory Given   Pulmonary neg shortness of breath, neg sleep apnea, neg COPD, Recent URI , Residual Cough, Current Smoker   Pulmonary exam normal        Cardiovascular hypertension, (-) angina (-) CAD, (-) Past MI and (-) CABG negative cardio ROS Normal cardiovascular exam     Neuro/Psych negative neurological ROS  negative psych ROS   GI/Hepatic Neg liver ROS,GERD  ,,  Endo/Other  negative endocrine ROS    Renal/GU negative Renal ROS  negative genitourinary   Musculoskeletal   Abdominal   Peds  Hematology negative hematology ROS (+)   Anesthesia Other Findings Past Medical History: No date: GERD (gastroesophageal reflux disease) No date: History of diverticulitis No date: Hyperlipidemia No date: Hypertension  Past Surgical History: No date: NO PAST SURGERIES  BMI    Body Mass Index: 31.22 kg/m      Reproductive/Obstetrics negative OB ROS                             Anesthesia Physical Anesthesia Plan  ASA: 2  Anesthesia Plan: General   Post-op Pain Management: Minimal or no pain anticipated   Induction: Intravenous  PONV Risk Score and Plan: 3 and Propofol infusion, TIVA and Ondansetron  Airway Management Planned: Nasal Cannula  Additional Equipment: None  Intra-op Plan:   Post-operative Plan:   Informed Consent: I have reviewed the patients History and Physical, chart, labs and discussed the procedure including the risks, benefits and alternatives for the proposed anesthesia with the patient or  authorized representative who has indicated his/her understanding and acceptance.     Dental advisory given  Plan Discussed with: CRNA and Surgeon  Anesthesia Plan Comments: (Discussed risks of anesthesia with patient, including possibility of difficulty with spontaneous ventilation under anesthesia necessitating airway intervention, PONV, and rare risks such as cardiac or respiratory or neurological events, and allergic reactions. Discussed the role of CRNA in patient's perioperative care. Patient understands.)        Anesthesia Quick Evaluation

## 2023-07-08 NOTE — Op Note (Signed)
[facility] Gastroenterology Patient Name: Paul Grant Procedure Date: 07/08/2023 11:17 AM MRN: [id number] Account #: [id number] Date of Birth: [birth year] Admit Type: Outpatient Age: 57 Room: [facility] ENDO ROOM 3 Gender: Male Note Status: Finalized Instrument Name: Nelda Marseille [id number] Procedure:             Colonoscopy Indications:           Surveillance: Personal history of adenomatous polyps                         on last colonoscopy 3 years ago Providers:             Eather Colas MD, MD Referring MD:          Kateri [facility] [place] (Referring MD) Medicines:             Monitored Anesthesia Care Complications:         No immediate complications. Estimated blood loss:                         Minimal. Procedure:             Pre-Anesthesia Assessment:                        - Prior to the procedure, a History and Physical was                         performed, and patient medications and allergies were                         reviewed. The patient is competent. The risks and                         benefits of the procedure and the sedation options and                         risks were discussed with the patient. All questions                         were answered and informed consent was obtained.                         Patient identification and proposed procedure were                         verified by the physician, the nurse, the                         anesthesiologist, the anesthetist and the technician                         in the endoscopy suite. Mental Status Examination:                         alert and oriented. Airway Examination: normal                         oropharyngeal airway and neck mobility. Respiratory  Examination: clear to auscultation. CV Examination:                         normal. Prophylactic Antibiotics: The patient does not                         require prophylactic antibiotics. Prior                          Anticoagulants: The patient has taken no anticoagulant                         or antiplatelet agents. ASA Grade Assessment: II - A                         patient with mild systemic disease. After reviewing                         the risks and benefits, the patient was deemed in                         satisfactory condition to undergo the procedure. The                         anesthesia plan was to use monitored anesthesia care                         (MAC). Immediately prior to administration of                         medications, the patient was re-assessed for adequacy                         to receive sedatives. The heart rate, respiratory                         rate, oxygen saturations, blood pressure, adequacy of                         pulmonary ventilation, and response to care were                         monitored throughout the procedure. The physical                         status of the patient was re-assessed after the                         procedure.                        After obtaining informed consent, the colonoscope was                         passed under direct vision. Throughout the procedure,                         the patient's blood pressure, pulse, and oxygen  saturations were monitored continuously. The                         Colonoscope was introduced through the anus and                         advanced to the the terminal ileum, with                         identification of the appendiceal orifice and IC                         valve. The colonoscopy was performed without                         difficulty. The patient tolerated the procedure well.                         The quality of the bowel preparation was fair. The                         terminal ileum, ileocecal valve, appendiceal orifice,                         and rectum were photographed. Findings:      The perianal and digital rectal examinations  were normal.      The terminal ileum appeared normal.      A moderate amount of semi-liquid stool was found in the recto-sigmoid       colon, in the sigmoid colon, in the descending colon, in the ascending       colon and in the cecum, interfering with visualization.      Scattered small-mouthed diverticula were found in the sigmoid colon,       descending colon, transverse colon and ascending colon.      A 5 mm polyp was found in the transverse colon. The polyp was sessile.       The polyp was removed with a cold snare. Resection and retrieval were       complete. Estimated blood loss was minimal.      Internal hemorrhoids were found during retroflexion. The hemorrhoids       were Grade I (internal hemorrhoids that do not prolapse).      The exam was otherwise without abnormality on direct and retroflexion       views. Impression:            - Preparation of the colon was fair.                        - The examined portion of the ileum was normal.                        - Stool in the recto-sigmoid colon, in the sigmoid                         colon, in the descending colon, in the ascending colon                         and in the cecum.                        -  Diverticulosis in the sigmoid colon, in the                         descending colon, in the transverse colon and in the                         ascending colon.                        - One 5 mm polyp in the transverse colon, removed with                         a cold snare. Resected and retrieved.                        - Internal hemorrhoids.                        - The examination was otherwise normal on direct and                         retroflexion views. Recommendation:        - Discharge patient to home.                        - Resume previous diet.                        - Continue present medications.                        - Await pathology results.                        - Repeat colonoscopy in 2 years because  the bowel                         preparation was suboptimal.                        - Return to referring physician as previously                         scheduled. Procedure Code(s):     --- Professional ---                        [phone], Colonoscopy, flexible; with removal of                         tumor(s), polyp(s), or other lesion(s) by snare                         technique Diagnosis Code(s):     --- Professional ---                        Z86.010, Personal history of colonic polyps                        K64.0, First degree hemorrhoids  D12.3, Benign neoplasm of transverse colon (hepatic                         flexure or splenic flexure)                        K57.30, Diverticulosis of large intestine without                         perforation or abscess without bleeding CPT copyright 2022 [employer]. All rights reserved. The codes documented in this report are preliminary and upon coder review may  be revised to meet current compliance requirements. Eather Colas MD, MD 07/08/2023 11:40:16 AM Number of Addenda: 0 Note Initiated On: 07/08/2023 11:17 AM Scope Withdrawal Time: 0 hours 6 minutes 50 seconds  Total Procedure Duration: 0 hours 10 minutes 58 seconds  Estimated Blood Loss:  Estimated blood loss was minimal.      [facility]

## 2023-07-08 NOTE — H&P (Signed)
Outpatient short stay form Pre-procedure 07/08/2023  [name], MD  Primary Physician: [facility]  Reason for visit:  Surveillance  History of present illness:    57 y/o gentleman with history of hypertension, HLD, and gastroparesis here for colonoscopy for three Ta's on last colonoscopy done three years ago. No blood thinners. No family history of GI malignancies. No significant abdominal surgeries.    Current Facility-Administered Medications:    0.9 %  sodium chloride infusion, , Intravenous, Continuous, Reginia Battie, Rossie Muskrat, MD, Last Rate: 40 mL/hr at 07/08/23 0953, New Bag at 07/08/23 0953  Medications Prior to Admission  Medication Sig Dispense Refill Last Dose/Taking   aspirin 81 MG EC tablet Take 1 tablet by mouth daily.   07/07/2023   lisinopril (ZESTRIL) 2.5 MG tablet Take 2.5 mg by mouth daily.   07/07/2023   metoprolol succinate (TOPROL-XL) 50 MG 24 hr tablet Take 50 mg by mouth daily.   07/07/2023   pantoprazole (PROTONIX) 40 MG tablet Take 40 mg by mouth daily.   07/07/2023   rosuvastatin (CRESTOR) 10 MG tablet Take 10 mg by mouth daily.   07/07/2023   benzonatate (TESSALON) 100 MG capsule Take 2 capsules (200 mg total) by mouth every 8 (eight) hours. (Patient not taking: Reported on 07/08/2023) 21 capsule 0 Completed Course   diltiazem (CARDIZEM CD) 240 MG 24 hr capsule Take 240 mg by mouth daily. (Patient not taking: Reported on 10/08/2022)      esomeprazole (NEXIUM) 20 MG capsule Take 1 capsule by mouth daily. (Patient not taking: Reported on 07/08/2023)   Not Taking   ipratropium (ATROVENT) 0.06 % nasal spray Place 2 sprays into both nostrils 4 (four) times daily. 15 mL 12    ivabradine (CORLANOR) 5 MG TABS tablet Take tablets (10mg ) TWO hours prior to your cardiac CT scan. (Patient not taking: Reported on 07/08/2023) 2 tablet 0 Not Taking   losartan (COZAAR) 50 MG tablet Take 50 mg by mouth daily. (Patient not taking: Reported on 10/08/2022)       meloxicam (MOBIC) 15 MG tablet Take 1 tablet (15 mg total) by mouth daily as needed for pain. (Patient not taking: Reported on 07/08/2023) 30 tablet 0 Not Taking   metoprolol tartrate (LOPRESSOR) 100 MG tablet Take tablet TWO hours prior to your cardiac CT scan. 1 tablet 0    promethazine-dextromethorphan (PROMETHAZINE-DM) 6.25-15 MG/5ML syrup Take 5 mLs by mouth 4 (four) times daily as needed. 118 mL 0    sildenafil (VIAGRA) 100 MG tablet TAKE 1 TABLET BY MOUTH ONCE DAILY AS NEEDED FOR ERECTILE DYSFUNCTION UP TO FOR 30 DAYS      spironolactone (ALDACTONE) 25 MG tablet Take 12.5 mg by mouth daily. (Patient not taking: Reported on 07/08/2023)   Not Taking   tiZANidine (ZANAFLEX) 4 MG tablet Take 1 tablet (4 mg total) by mouth every 8 (eight) hours as needed for muscle spasms. 30 tablet 0      Allergies  Allergen Reactions   Losartan Palpitations     Past Medical History:  Diagnosis Date   ASCVD (arteriosclerotic cardiovascular disease)    Erectile dysfunction    GERD (gastroesophageal reflux disease)    History of diverticulitis    Hyperlipidemia    Hypertension     Review of systems:  Otherwise negative.    Physical Exam  Gen: Alert, oriented. Appears stated age.  HEENT: PERRLA. Lungs: No respiratory distress CV: RRR Abd: soft, benign, no masses Ext: No edema    Planned  procedures: Proceed with colonoscopy. The patient understands the nature of the planned procedure, indications, risks, alternatives and potential complications including but not limited to bleeding, infection, perforation, damage to internal organs and possible oversedation/side effects from anesthesia. The patient agrees and gives consent to proceed.  Please refer to procedure notes for findings, recommendations and patient disposition/instructions.     [name], MD [facility] Gastroenterology

## 2023-07-08 NOTE — Interval H&P Note (Signed)
History and Physical Interval Note:  07/08/2023 11:16 AM  Paul Grant  has presented today for surgery, with the diagnosis of HX OF ADENOMATOUS POLYP OF COLON.  The various methods of treatment have been discussed with the patient and family. After consideration of risks, benefits and other options for treatment, the patient has consented to  Procedure(s): COLONOSCOPY WITH PROPOFOL (N/A) as a surgical intervention.  The patient's history has been reviewed, patient examined, no change in status, stable for surgery.  I have reviewed the patient's chart and labs.  Questions were answered to the patient's satisfaction.     [name]  Ok to proceed with colonoscopy

## 2023-07-08 NOTE — Transfer of Care (Signed)
Immediate Anesthesia Transfer of Care Note  Patient: Paul Grant  Procedure(s) Performed: COLONOSCOPY WITH PROPOFOL  Patient Location: PACU  Anesthesia Type:General  Level of Consciousness: drowsy and patient cooperative  Airway & Oxygen Therapy: Patient Spontanous Breathing  Post-op Assessment: Report given to RN and Post -op Vital signs reviewed and stable  Post vital signs: stable  Last Vitals:  Vitals Value Taken Time  BP    Temp 36.1 C 07/08/23 1140  Pulse 99 07/08/23 1140  Resp 14 07/08/23 1140  SpO2 98 % 07/08/23 1140    Last Pain:  Vitals:   07/08/23 0946  TempSrc: Temporal  PainSc: 0-No pain         Complications: No notable events documented.

## 2023-07-09 ENCOUNTER — Encounter: Payer: Self-pay | Admitting: Gastroenterology

## 2023-07-09 LAB — SURGICAL PATHOLOGY

## 2023-09-08 IMAGING — CT CT CHEST LUNG CANCER SCREENING LOW DOSE W/O CM
1 series · 15 of 32 positions shown, 19 images · non-contrast
Comparison: 11/28/2012

CLINICAL DATA: Lung cancer screening. Thirty pack-year history.
Current asymptomatic smoker.



[Series 2: axial st · axial · 0.75mm/px · z∈[-870,-560]mm · 15 of 71 slices shown, 19 images]
[im 6/71  mediastinal]
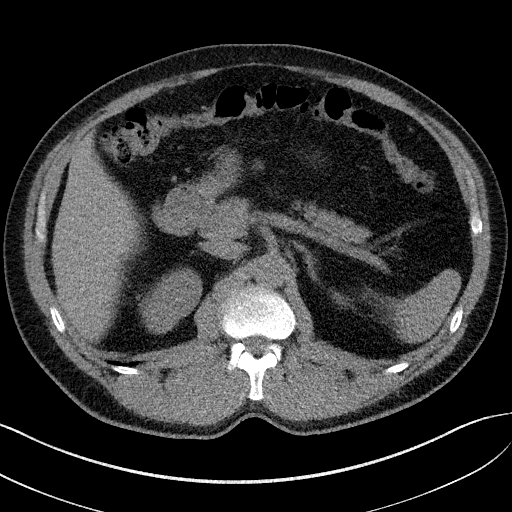
[im 6/71  lung]
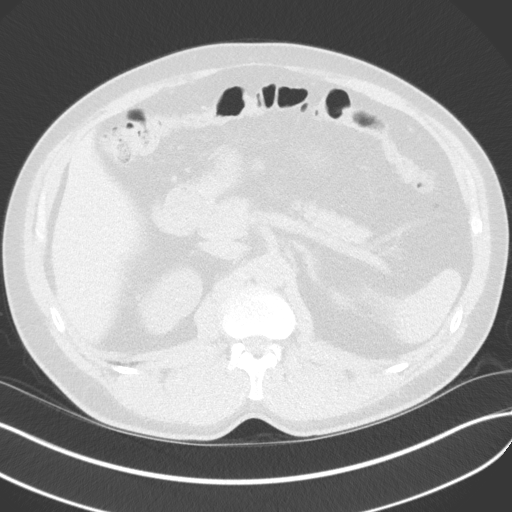
[im 11/71  lung]
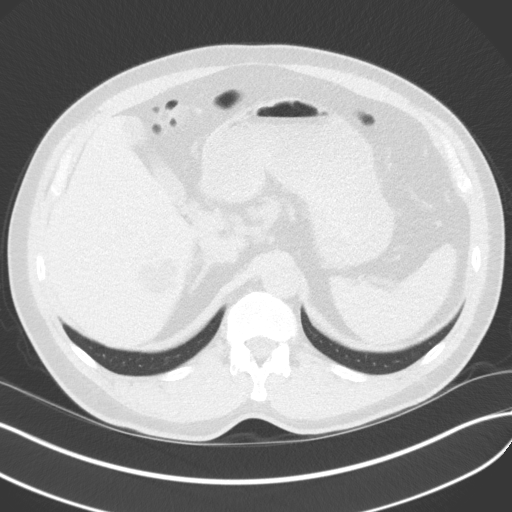
[im 15/71  lung]
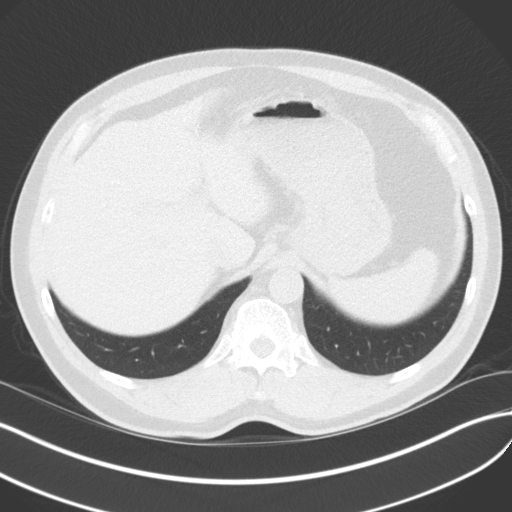
[im 19/71  lung]
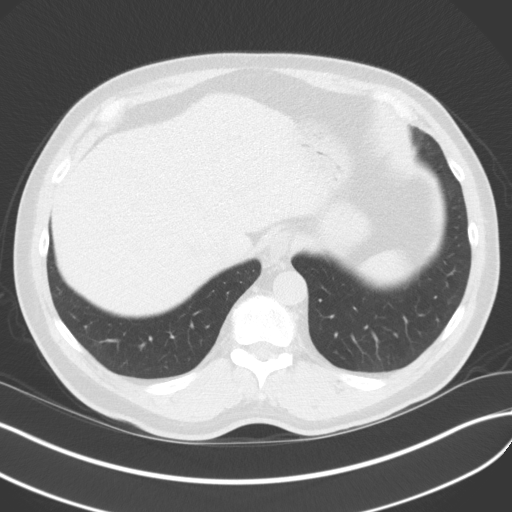
[im 24/71  mediastinal]
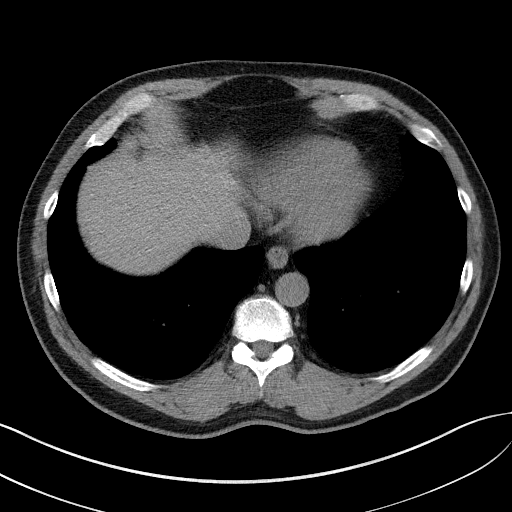
[im 24/71  lung]
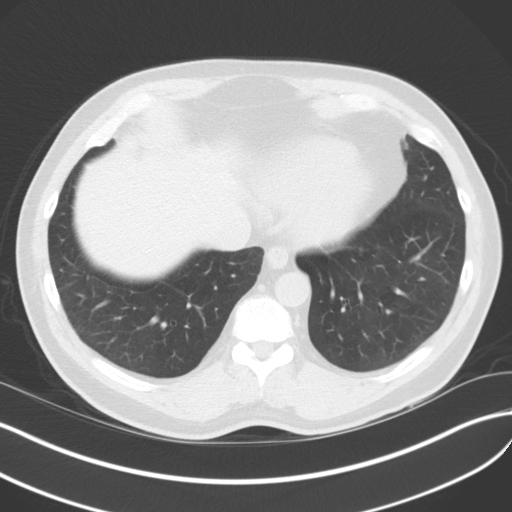
[im 29/71  lung]
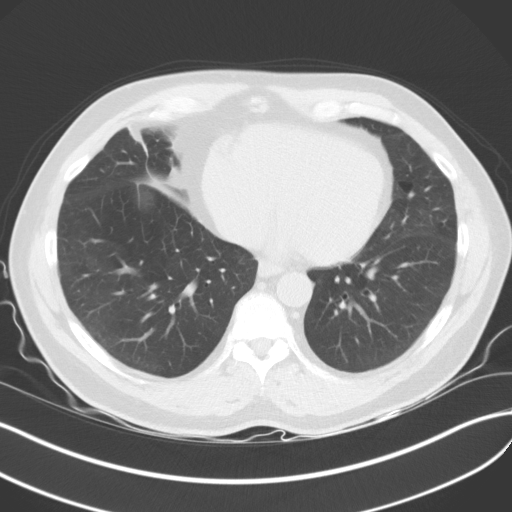
[im 34/71  lung]
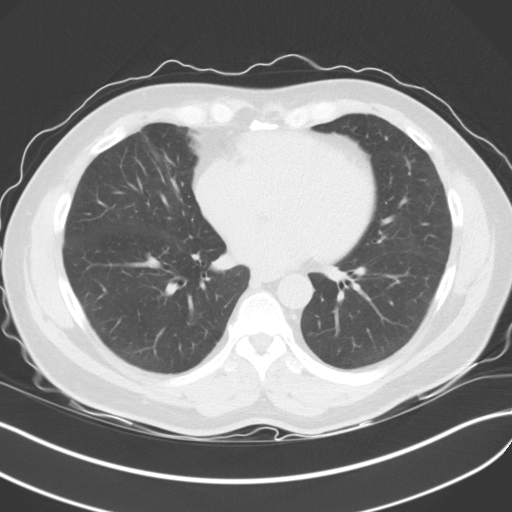
[im 38/71  lung]
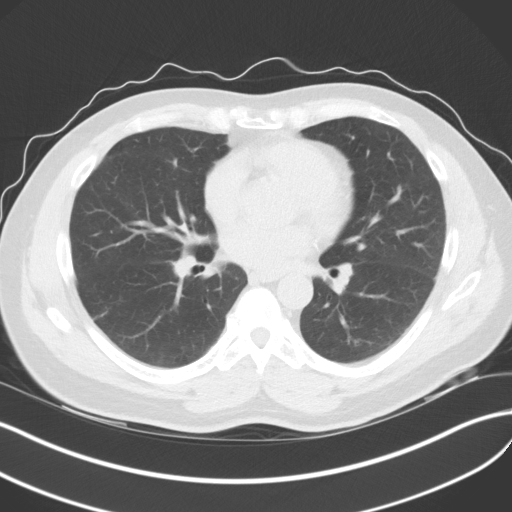
[im 42/71  mediastinal]
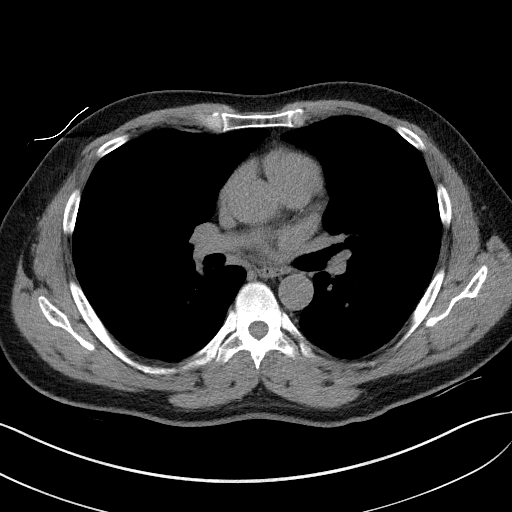
[im 42/71  lung]
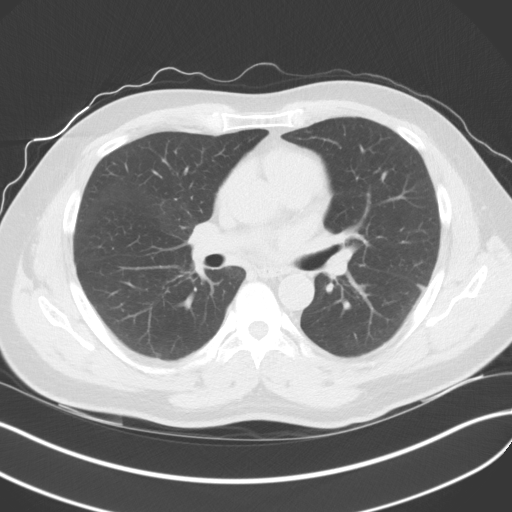
[im 45/71  lung]
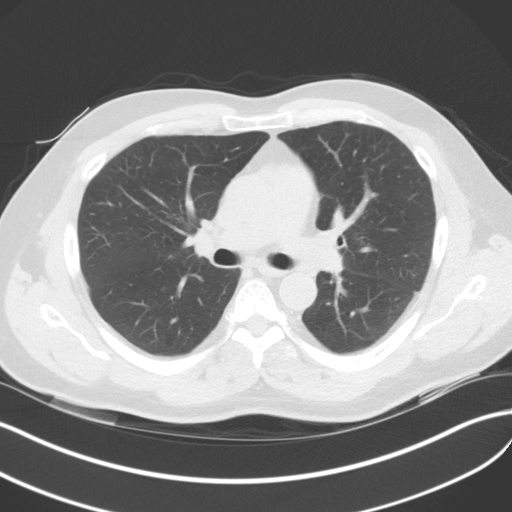
[im 50/71  lung]
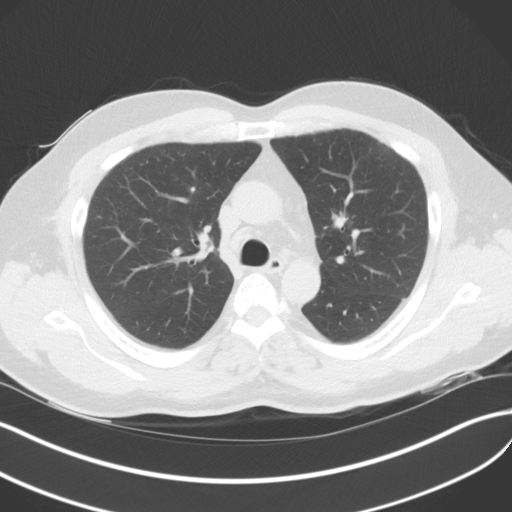
[im 55/71  lung]
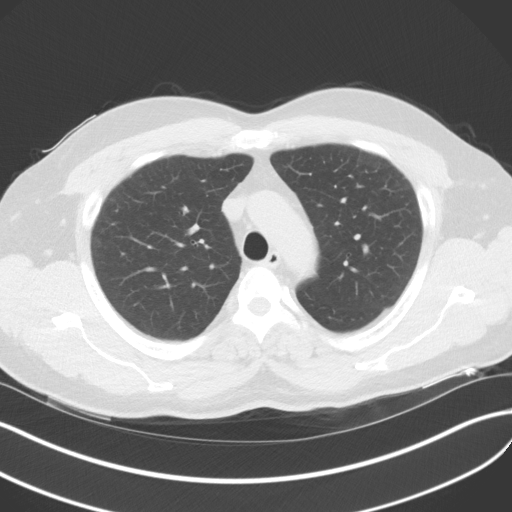
[im 58/71  mediastinal]
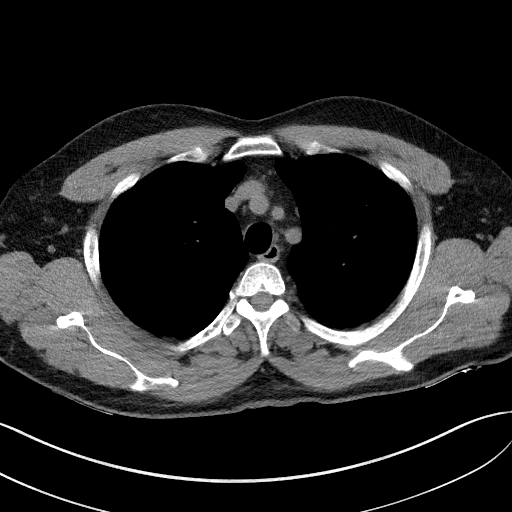
[im 58/71  lung]
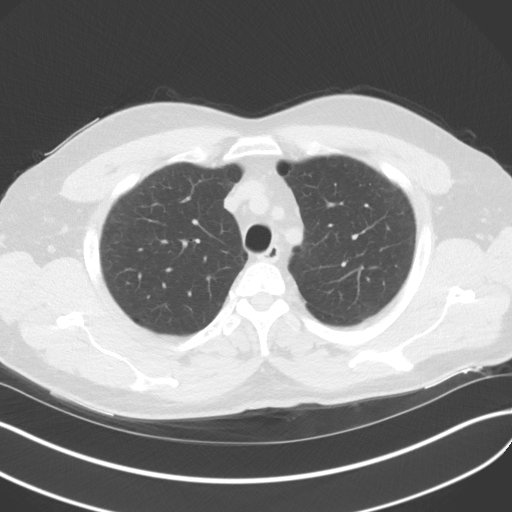
[im 63/71  lung]
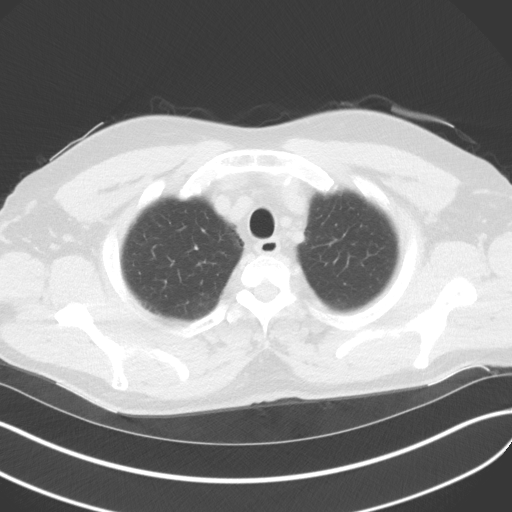
[im 68/71  lung]
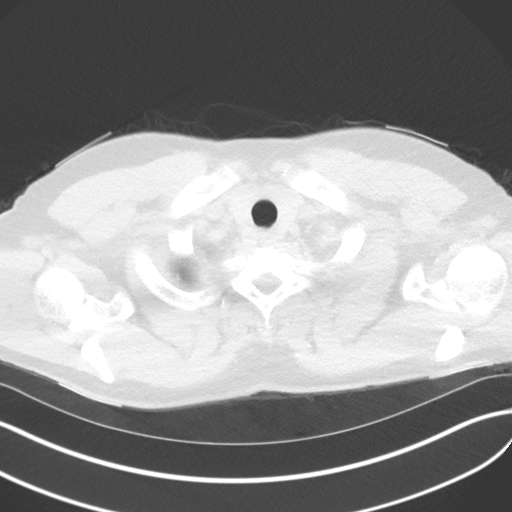

[15 of 32 positions shown; findings below may reference images not displayed]

FINDINGS: Cardiovascular: Heart size is normal.  No pericardial effusion.

Mediastinum/Nodes: No enlarged mediastinal, hilar, or axillary lymph
nodes. Thyroid gland, trachea, and esophagus demonstrate no
significant findings.

Lungs/Pleura: Mild paraseptal and centrilobular emphysema. There is
no pleural effusion or airspace consolidation identified. Scarring
noted within the right middle lobe. Two small lung nodules within
the left upper lobe measure up to 4.5 mm. No suspicious lung nodules
identified at this time.

Upper Abdomen: No acute abnormality. 3.3 cm cyst is noted within the
posteromedial right hepatic lobe. Partially visualized fluid density
structure arising off the anterior cortex of the upper pole of right
kidney measures 5.7 cm and is compatible with a benign Bosniak class
1 cyst. No follow-up recommended.

Musculoskeletal: No chest wall mass or suspicious bone lesions
identified.
IMPRESSION: 1. Lung-RADS 2, benign appearance or behavior. Continue annual
screening with low-dose chest CT without contrast in 12 months.
2. Emphysema ([id number]).

## 2023-09-11 ENCOUNTER — Other Ambulatory Visit: Payer: Self-pay | Admitting: Gastroenterology

## 2023-09-11 DIAGNOSIS — R1013 Epigastric pain: Secondary | ICD-10-CM

## 2023-09-20 ENCOUNTER — Ambulatory Visit
Admission: RE | Admit: 2023-09-20 | Discharge: 2023-09-20 | Disposition: A | Discharge status: Home / Self Care | Source: Ambulatory Visit | Attending: Gastroenterology | Admitting: Gastroenterology

## 2023-11-29 ENCOUNTER — Other Ambulatory Visit: Payer: Self-pay | Admitting: Internal Medicine

## 2023-11-29 DIAGNOSIS — I429 Cardiomyopathy, unspecified: Secondary | ICD-10-CM

## 2023-12-16 ENCOUNTER — Other Ambulatory Visit: Payer: Self-pay | Admitting: Internal Medicine

## 2023-12-16 ENCOUNTER — Ambulatory Visit
Admission: RE | Admit: 2023-12-16 | Discharge: 2023-12-16 | Disposition: A | Discharge status: Home / Self Care | Source: Ambulatory Visit | Attending: Internal Medicine | Admitting: Internal Medicine

## 2023-12-16 DIAGNOSIS — I429 Cardiomyopathy, unspecified: Secondary | ICD-10-CM

## 2023-12-16 MED ORDER — GADOBUTROL 1 MMOL/ML IV SOLN
12.0000 mL | Freq: Once | INTRAVENOUS | Status: AC | PRN
  Administered 2023-12-16: 12 mL via INTRAVENOUS
